# Patient Record
Sex: Male | Born: 1935 | Race: White | Hispanic: No | Marital: Single | Smoking: Former smoker
Health system: Southern US, Community
[De-identification: ages and names within clinical notes are randomized; demographics above are authoritative.]

## PROBLEM LIST (undated history)

## (undated) DIAGNOSIS — I10 Essential (primary) hypertension: Secondary | ICD-10-CM

## (undated) DIAGNOSIS — E7800 Pure hypercholesterolemia, unspecified: Secondary | ICD-10-CM

## (undated) DIAGNOSIS — M4620 Osteomyelitis of vertebra, site unspecified: Secondary | ICD-10-CM

## (undated) HISTORY — DX: Osteomyelitis of vertebra, site unspecified: M46.20

## (undated) HISTORY — PX: CORONARY STENT PLACEMENT: SHX1402

## (undated) HISTORY — PX: PROCTECTOMY: SHX315

---

## 2015-02-18 DIAGNOSIS — I10 Essential (primary) hypertension: Secondary | ICD-10-CM

## 2015-02-18 DIAGNOSIS — I2510 Atherosclerotic heart disease of native coronary artery without angina pectoris: Secondary | ICD-10-CM | POA: Insufficient documentation

## 2015-02-18 DIAGNOSIS — E7800 Pure hypercholesterolemia, unspecified: Secondary | ICD-10-CM

## 2015-02-18 HISTORY — DX: Essential (primary) hypertension: I10

## 2015-02-18 HISTORY — DX: Atherosclerotic heart disease of native coronary artery without angina pectoris: I25.10

## 2015-10-29 ENCOUNTER — Encounter (HOSPITAL_COMMUNITY): Payer: Self-pay | Admitting: Cardiology

## 2015-10-29 ENCOUNTER — Inpatient Hospital Stay (HOSPITAL_COMMUNITY)
Admission: EM | Admit: 2015-10-29 | Discharge: 2015-11-02 | DRG: 539 | Disposition: A | Discharge status: Home Health | Payer: Medicare Other | Attending: Family Medicine | Admitting: Family Medicine

## 2015-10-29 DIAGNOSIS — Z7902 Long term (current) use of antithrombotics/antiplatelets: Secondary | ICD-10-CM | POA: Diagnosis not present

## 2015-10-29 DIAGNOSIS — N400 Enlarged prostate without lower urinary tract symptoms: Secondary | ICD-10-CM | POA: Diagnosis present

## 2015-10-29 DIAGNOSIS — M4624 Osteomyelitis of vertebra, thoracic region: Secondary | ICD-10-CM | POA: Diagnosis present

## 2015-10-29 DIAGNOSIS — Z79899 Other long term (current) drug therapy: Secondary | ICD-10-CM | POA: Diagnosis not present

## 2015-10-29 DIAGNOSIS — M4644 Discitis, unspecified, thoracic region: Secondary | ICD-10-CM | POA: Diagnosis present

## 2015-10-29 DIAGNOSIS — Z955 Presence of coronary angioplasty implant and graft: Secondary | ICD-10-CM

## 2015-10-29 DIAGNOSIS — Z9889 Other specified postprocedural states: Secondary | ICD-10-CM | POA: Diagnosis not present

## 2015-10-29 DIAGNOSIS — K5900 Constipation, unspecified: Secondary | ICD-10-CM | POA: Diagnosis present

## 2015-10-29 DIAGNOSIS — E785 Hyperlipidemia, unspecified: Secondary | ICD-10-CM | POA: Diagnosis present

## 2015-10-29 DIAGNOSIS — Y92009 Unspecified place in unspecified non-institutional (private) residence as the place of occurrence of the external cause: Secondary | ICD-10-CM | POA: Diagnosis not present

## 2015-10-29 DIAGNOSIS — H9190 Unspecified hearing loss, unspecified ear: Secondary | ICD-10-CM | POA: Diagnosis present

## 2015-10-29 DIAGNOSIS — B9689 Other specified bacterial agents as the cause of diseases classified elsewhere: Secondary | ICD-10-CM | POA: Diagnosis not present

## 2015-10-29 DIAGNOSIS — E875 Hyperkalemia: Secondary | ICD-10-CM | POA: Diagnosis present

## 2015-10-29 DIAGNOSIS — I2510 Atherosclerotic heart disease of native coronary artery without angina pectoris: Secondary | ICD-10-CM | POA: Diagnosis present

## 2015-10-29 DIAGNOSIS — Z87891 Personal history of nicotine dependence: Secondary | ICD-10-CM | POA: Diagnosis not present

## 2015-10-29 DIAGNOSIS — M4626 Osteomyelitis of vertebra, lumbar region: Secondary | ICD-10-CM | POA: Diagnosis present

## 2015-10-29 DIAGNOSIS — Z800 Family history of malignant neoplasm of digestive organs: Secondary | ICD-10-CM | POA: Diagnosis not present

## 2015-10-29 DIAGNOSIS — G061 Intraspinal abscess and granuloma: Secondary | ICD-10-CM

## 2015-10-29 DIAGNOSIS — I10 Essential (primary) hypertension: Secondary | ICD-10-CM | POA: Diagnosis present

## 2015-10-29 DIAGNOSIS — Z7982 Long term (current) use of aspirin: Secondary | ICD-10-CM | POA: Diagnosis not present

## 2015-10-29 DIAGNOSIS — M4620 Osteomyelitis of vertebra, site unspecified: Secondary | ICD-10-CM

## 2015-10-29 DIAGNOSIS — W11XXXA Fall on and from ladder, initial encounter: Secondary | ICD-10-CM | POA: Diagnosis present

## 2015-10-29 HISTORY — DX: Essential (primary) hypertension: I10

## 2015-10-29 HISTORY — DX: Pure hypercholesterolemia, unspecified: E78.00

## 2015-10-29 HISTORY — DX: Osteomyelitis of vertebra, lumbar region: M46.26

## 2015-10-29 HISTORY — DX: Intraspinal abscess and granuloma: G06.1

## 2015-10-29 LAB — CBC WITH DIFFERENTIAL/PLATELET
Basophils Absolute: 0 10*3/uL (ref 0.0–0.1)
Basophils Relative: 0 %
Eosinophils Absolute: 0 10*3/uL (ref 0.0–0.7)
Eosinophils Relative: 0 %
HCT: 34.1 % — ABNORMAL LOW (ref 39.0–52.0)
Hemoglobin: 10.9 g/dL — ABNORMAL LOW (ref 13.0–17.0)
Lymphocytes Relative: 28 %
Lymphs Abs: 1 10*3/uL (ref 0.7–4.0)
MCH: 28.8 pg (ref 26.0–34.0)
MCHC: 32 g/dL (ref 30.0–36.0)
MCV: 90.2 fL (ref 78.0–100.0)
Monocytes Absolute: 0.1 10*3/uL (ref 0.1–1.0)
Monocytes Relative: 2 %
Neutro Abs: 2.4 10*3/uL (ref 1.7–7.7)
Neutrophils Relative %: 70 %
Platelets: 233 10*3/uL (ref 150–400)
RBC: 3.78 MIL/uL — ABNORMAL LOW (ref 4.22–5.81)
RDW: 16.3 % — ABNORMAL HIGH (ref 11.5–15.5)
WBC: 3.5 10*3/uL — ABNORMAL LOW (ref 4.0–10.5)

## 2015-10-29 LAB — BASIC METABOLIC PANEL
Anion gap: 10 (ref 5–15)
BUN: 12 mg/dL (ref 6–20)
CO2: 23 mmol/L (ref 22–32)
Calcium: 9.5 mg/dL (ref 8.9–10.3)
Chloride: 105 mmol/L (ref 101–111)
Creatinine, Ser: 0.87 mg/dL (ref 0.61–1.24)
GFR calc Af Amer: >60 mL/min (ref >60)
GFR calc non Af Amer: >60 mL/min (ref >60)
Glucose, Bld: 167 mg/dL — ABNORMAL HIGH (ref 65–99)
Potassium: 5.6 mmol/L — ABNORMAL HIGH (ref 3.5–5.1)
Sodium: 138 mmol/L (ref 135–145)

## 2015-10-29 LAB — C-REACTIVE PROTEIN: CRP: 1.5 mg/dL — ABNORMAL HIGH (ref <1.0)

## 2015-10-29 LAB — SEDIMENTATION RATE: Sed Rate: 90 mm/hr — ABNORMAL HIGH (ref 0–16)

## 2015-10-29 MED ORDER — SODIUM CHLORIDE 0.9 % IV SOLN
INTRAVENOUS | Status: DC
  Administered 2015-10-29 – 2015-10-31 (×2): via INTRAVENOUS

## 2015-10-29 MED ORDER — ACETAMINOPHEN 650 MG RE SUPP: 650.0000 mg | Freq: Four times a day (QID) | RECTAL | Status: DC | PRN

## 2015-10-29 MED ORDER — ACETAMINOPHEN 325 MG PO TABS
650.0000 mg | ORAL_TABLET | Freq: Four times a day (QID) | ORAL | Status: DC | PRN
  Administered 2015-10-29 – 2015-10-31 (×4): 650 mg via ORAL
  Filled 2015-10-29 (×4): qty 2

## 2015-10-29 MED ORDER — HEPARIN SODIUM (PORCINE) 5000 UNIT/ML IJ SOLN
5000.0000 [IU] | Freq: Three times a day (TID) | INTRAMUSCULAR | Status: DC
  Administered 2015-10-29: 5000 [IU] via SUBCUTANEOUS
  Filled 2015-10-29 (×2): qty 1

## 2015-10-29 NOTE — ED Provider Notes (Signed)
CSN: [id number]     Arrival date & time 10/29/15  1355 History   First MD Initiated Contact with Patient 10/29/15 1601     Chief Complaint  Patient presents with  . Back Pain     (Consider location/radiation/quality/duration/timing/severity/associated sxs/prior Treatment) HPI   85yM sent for evaluation of "infection in my back." Reports that he had MRI of his back yesterday done through [facility] and advised he should come to [facility]. Unfortunately, he is not sure of specifics and records not immediately available.  He has been having back pain for several months. Mid to lower back. Initially started after fall from ladder while painting. Sometimes radiates around into b/l flanks. During this time he was diagnosed and treated for bladder/ureteral stones as well as having what sounds like a prostatectomy. His pain has continued to wax/wane and "move" despite urologic interventions.   Denies fever or chills. Back pain doesn't seem any worse with movement. Denies hx of back surgery. No acute numbness, tingling or loss of strength. Denies change in sensation. On ASA/plavix. No incontinence or retention. Reports has been urinating better since prostatectomy.   Past Medical History  Diagnosis Date  . Hypertension   . Hypercholesteremia    Past Surgical History  Procedure Laterality Date  . Coronary stent placement    . Proctectomy     History reviewed. No pertinent family history. Social History  Substance Use Topics  . Smoking status: Never Smoker   . Smokeless tobacco: None  . Alcohol Use: No    Review of Systems  All systems reviewed and negative, other than as noted in HPI.   Allergies  Review of patient's allergies indicates no known allergies.  Home Medications   Prior to Admission medications   Not on File   BP 128/91 mmHg  Pulse 70  Temp(Src) 97.6 F (36.4 C) (Oral)  Resp 18  SpO2 92% Physical Exam  Constitutional: He appears well-developed and  well-nourished. No distress.  HENT:  Head: Normocephalic and atraumatic.  Eyes: Conjunctivae are normal. Right eye exhibits no discharge. Left eye exhibits no discharge.  Neck: Neck supple.  Cardiovascular: Normal rate, regular rhythm and normal heart sounds.  Exam reveals no gallop and no friction rub.   No murmur heard. Pulmonary/Chest: Effort normal and breath sounds normal. No respiratory distress.  Abdominal: Soft. He exhibits no distension. There is no tenderness.  Musculoskeletal: He exhibits edema. He exhibits no tenderness.  Pitting LE edema. Pt reports baseline.   Neurological: He is alert. No cranial nerve deficit. He exhibits normal muscle tone. Coordination normal.  Strength 5/5 b/l LE ext. Sensation intact to light touch.   Skin: Skin is warm and dry.  Psychiatric: He has a normal mood and affect. His behavior is normal. Thought content normal.  Nursing note and vitals reviewed.   ED Course  Procedures (including critical care time) Labs Review Labs Reviewed  SEDIMENTATION RATE - Abnormal; Notable for the following:    Sed Rate 90 (*)    All other components within normal limits  C-REACTIVE PROTEIN - Abnormal; Notable for the following:    CRP 1.5 (*)    All other components within normal limits  CBC WITH DIFFERENTIAL/PLATELET - Abnormal; Notable for the following:    WBC 3.5 (*)    RBC 3.78 (*)    Hemoglobin 10.9 (*)    HCT 34.1 (*)    RDW 16.3 (*)    All other components within normal limits  BASIC METABOLIC PANEL -  Abnormal; Notable for the following:    Potassium 5.6 (*)    Glucose, Bld 167 (*)    All other components within normal limits  BASIC METABOLIC PANEL - Abnormal; Notable for the following:    Glucose, Bld 112 (*)    All other components within normal limits  CBC - Abnormal; Notable for the following:    RBC 3.31 (*)    Hemoglobin 9.3 (*)    HCT 29.6 (*)    RDW 16.1 (*)    All other components within normal limits  PROTIME-INR - Abnormal;  Notable for the following:    Prothrombin Time 15.4 (*)    All other components within normal limits  CBC - Abnormal; Notable for the following:    WBC 3.7 (*)    RBC 3.42 (*)    Hemoglobin 9.7 (*)    HCT 31.0 (*)    RDW 16.2 (*)    All other components within normal limits  CULTURE, BLOOD (ROUTINE X 2)  CULTURE, BLOOD (ROUTINE X 2)  GRAM STAIN  CULTURE, ROUTINE-ABSCESS  APTT  VANCOMYCIN, TROUGH  CREATININE, SERUM    Imaging Review No results found. I have personally reviewed and evaluated these images and lab results as part of my medical decision-making.   EKG Interpretation None      MDM   Final diagnoses:  Vertebral osteomyelitis (HCC)  Spinal epidural abscess    85yM sent for evaluation of "infection in his back." Vertebral osteomyelitis? SEA? [facility]. Araceli BoucheShould've been read by [facility]. Will obtain report in addition to recent records [facility].   Received MRI report (most pertinent below). [facility].   "There is fluid in disc space and destruction of the adjacent endplates at T11-12 compatible with disc osteomyelitis. Epidural collection extends 4 cm from the T10-11 level through the midbody of T12. The extends into both neural foramina at T11-12. There is diffuse abnormal marrow signal. Extensive heterogenous fluid extends into the paraspinous soft tissues at the T11-12 level."  He appears very well. Afebrile. No acute neurological complaints. Nonfocal neuro exam. No murmur appreciated. Will place IV. Basic labs, blood cultures and inflammatory markers. Discussed briefly with Dr Luciana Axeomer, ID. Confirms he would wait for abx until biopsy completed. Medicine admission. Likely IR consult for biopsy versus less likely open with neurosurgery.   Raeford RazorStephen Rachal Dvorsky, MD 11/03/15 [phone]

## 2015-10-29 NOTE — ED Notes (Signed)
Pt reports that he had an MRI yesterday and told that he has an infection in his back. Sent here for antibiotics.

## 2015-10-29 NOTE — H&P (Signed)
[facility] Admission History and Physical Service Pager: [phone]  Patient name: Jacob Benitez Medical record number: [id number] Date of birth: April 29, 1930 Age: 82 y.o. Gender: male  Primary Care Provider: No primary care provider on file. Consultants: ID Code Status: FULL   Chief Complaint: Infection in Back   Assessment and Plan: Jacob Benitez is a 82 y.o. male presenting with back pain secondary to vertebral osteomyelitis and spinal epidural abscess. PMH is significant for HTN, HLD, CAD s/p stent, and s/p cystotomy and prostatectomy.  Vertebral Osteomyelitis and Spinal Epidural Abscess: MRI performed yesterday at outside imaging location. Per ED provider's note, most pertinent part of MRI read as follows: There is fluid in disc space and destruction of the adjacent endplates at I95-18 compatible with disc osteomyelitis. Epidural collection extends 4 cm from the T10-11 level through the midbody of T12. The extends into both neural foramina at T11-12. There is diffuse abnormal marrow signal. Extensive heterogenous fluid extends into the paraspinous soft tissues at the T11-12 level. Patient without fever, leukocytosis, or neurological deficits on exam. CRP elevated to 1.5 and ESR elevated to 90. ED provider discussed with ID (Jacob Benitez) who stated to wait for antibiotics until biopsy completed. Suspect this infection related to complication from recent surgical procedures.  -admit to MedSurg, vital signs per unit  -consult IR in AM for possible biopsy  -confirmed with radiology dept that they are able to visualize the actual MRI on the PACs system -will not start antibiotics at present per ID recs; patient will likely need prolonged course of IV antibiotics  -CBC in AM  -blood cultures x2  -tylenol prn for pain, will escalate to stronger pain medications if needed   Hyperkalemia: K elevated to 5.6 on admission. EKG with T wave changes.   -BMET in AM  -no  intervention at present   HTN: Normotensive at admission.  -awaiting confirmation of home medications  -continue to monitor  CAD s/p stent: Daughter believes patient may still be holding ASA and Plavix s/p surgery.  -awaiting confirmation of current medication regimen   HLD: Daughter believes patient is on a statin. From medication list from care everywhere appears he might be taking Simvastatin 40 mg.  -confirm with daughter   BPH S/p Cystotomy and Prostatectomy: Reports urinating much better since surgery. Daughter does not believe he has continued Terazosin since surgery.  -will not order Terazosin unless confirmed still on medication list   Dementia: Daughter reports that patient makes most of his own decisions and she does not believe his memory is bad. Aricept is listed on medication list from care everywhere.  -confirm with daughter tomorrow   Hearing Loss: Stable. Daughter notes he is supposed to go to the [place] later this month to obtain hearing aids.   Med list from care everywhere aspirin (ECOTRIN) 81 MG tablet, Take 81 mg by mouth daily., Disp: , Rfl:  . calcium-vitamin D 500 mg(1,273m) -200 unit per tablet, Take 1 tablet by mouth 2 (two) times a day with meals., Disp: , Rfl:  . clopidogrel (PLAVIX) 75 mg tablet, TAKE ONE TABLET BY MOUTH ONCE DAILY. DUE FOR APPOINTMENT., Disp: 30 tablet, Rfl: 0 . donepezil (ARICEPT) 10 MG tablet, Take 10 mg by mouth Two (2) times a day., Disp: , Rfl:  . lisinopril (PRINIVIL,ZESTRIL) 20 MG tablet, TAKE ONE TABLET BY MOUTH ONCE DAILY, Disp: 30 tablet, Rfl: 6 . multivitamin (THERAGRAN) per tablet, Take 1 tablet by mouth daily., Disp: , Rfl:  . nitroglycerin (  NITROSTAT) 0.4 MG SL tablet, Place 0.4 mg under the tongue every five (5) minutes as needed for chest pain., Disp: , Rfl:  . OMEGA-3/DHA/EPA/FISH OIL (FISH OIL-OMEGA-3 FATTY ACIDS) 300-1,000 mg capsule, Take 1 g by mouth. Take 3 Capsules Daily, Disp: , Rfl:  . simvastatin (ZOCOR) 40 MG tablet,  TAKE ONE TABLET BY MOUTH ONCE DAILY.DUE FOR APPOINTMENT., Disp: 30 tablet, Rfl: 0 . terazosin (HYTRIN) 5 MG capsule, Take 5 mg by mouth nightly., Disp: , Rfl:    FEN/GI: Regular Diet, NPO at MN, NS at 75 cc/hr  Prophylaxis: Heparin   Disposition: Admit to FPTS; Attending Jacob Benitez   History of Present Illness:  Jacob Benitez is a 82 y.o. male presenting with back pain. Daughter at bedside helps with history, patient is hard of hearing. He developed back pain in January, went to the hospital and scans showed kidney stones which required removal (including removal of the prostate). He continued to have pain in the back several weeks later so they did an MRI of the spine which showed osteomyelitis and spinal epidural abscess. Pain is not currently "that bad", it seems to come and go, severe at times up to 10/10. Pain is worse with movement. No tingling, numbness of his legs. He has had swelling of his legs since January.   No known prior history of infections. There was a history of "hardening" on his foot 6 months ago. Unsure if he was treated with antibiotics. Denies known history of osteomyelitis or bacteremia.   Review Of Systems: Per HPI with the following additions: no recent fevers, no nausea or vomiting, no changes in bowel pattern (no reported diarrhea or constipation), voiding improved since prostate procedure Otherwise the remainder of the systems were negative.  Patient Active Problem List   Diagnosis Date Noted  . Spinal epidural abscess 10/29/2015    Past Medical History: Past Medical History  Diagnosis Date  . Hypertension   . Hypercholesteremia     Past Surgical History: Past Surgical History  Procedure Laterality Date  . Coronary stent placement    . Proctectomy      Social History: Social History  Substance Use Topics  . Smoking status: Never Smoker   . Smokeless tobacco: None  . Alcohol Use: No   Additional social history: lives with wife at home, has had  PT doing home visits Please also refer to relevant sections of EMR.  Family History: Family History  Problem Relation Age of Onset  . Colon cancer Mother    Allergies and Medications: No Known Allergies No current facility-administered medications on file prior to encounter.   No current outpatient prescriptions on file prior to encounter.    Objective: BP 120/66 mmHg  Pulse 72  Temp(Src) 97.6 F (36.4 C) (Oral)  Resp 16  SpO2 97% Exam: General: Elderly male lying in bed, non-toxic, NAD  Eyes: EOMI. PERRL.  ENTM: Oropharynx clear. Hard of hearing.  Neck: Full ROM. No rigidity.  Cardiovascular: RRR. No murmurs appreciated. LE cool to palpation. 1+ pedal pulses bilaterally. 1+ pitting edema to LE bilaterally.  Respiratory: CTAB. Normal WOB.  Abdomen: +BS, soft, NTND, surgical scar across suprapubic region with foreign device vs. Scar tissue palpated under incision scar  MSK: Moves all extremities spontaneously. Small area of erythema around lumbar spine. No edema or fluctuance appreciated. Spine non-tender to palpation.  Skin: No rashes appreciated. Erythema of lumbar spine as above Neuro: Alert. Sensation grossly intact. Strength in LE 5/5 bilaterally. Grip strength 5/5 bilaterally.  Psych: Normal mood and affect.   Labs and Imaging: CBC BMET   Recent Labs Lab 10/29/15 1723  WBC 3.5*  HGB 10.9*  HCT 34.1*  PLT 233    Recent Labs Lab 10/29/15 1723  NA 138  K 5.6*  CL 105  CO2 23  BUN 12  CREATININE 0.87  GLUCOSE 167*  CALCIUM 9.5     ESR 90 CRP 1.5   Excerpt from MRI Report: There is fluid in disc space and destruction of the adjacent endplates at V20-03 compatible with disc osteomyelitis. Epidural collection extends 4 cm from the T10-11 level through the midbody of T12. The extends into both neural foramina at T11-12. There is diffuse abnormal marrow signal. Extensive heterogenous fluid extends into the paraspinous soft tissues at the T11-12  level."  Nicolette Bang, DO 10/29/2015, 6:46 PM PGY-1, [place] Intern pager: [phone], text pages welcome  I have seen and examined the patient. I have read and agree with the above note. My changes are noted in blue.  Tawanna Sat, MD 10/30/2015, 2:17 AM PGY-3, [place] Intern Pager: [phone], text pages welcome

## 2015-10-30 ENCOUNTER — Inpatient Hospital Stay (HOSPITAL_COMMUNITY): Payer: Medicare Other

## 2015-10-30 DIAGNOSIS — M4644 Discitis, unspecified, thoracic region: Secondary | ICD-10-CM

## 2015-10-30 DIAGNOSIS — G061 Intraspinal abscess and granuloma: Secondary | ICD-10-CM

## 2015-10-30 DIAGNOSIS — M4626 Osteomyelitis of vertebra, lumbar region: Secondary | ICD-10-CM

## 2015-10-30 DIAGNOSIS — M4620 Osteomyelitis of vertebra, site unspecified: Secondary | ICD-10-CM

## 2015-10-30 DIAGNOSIS — B9689 Other specified bacterial agents as the cause of diseases classified elsewhere: Secondary | ICD-10-CM

## 2015-10-30 LAB — GRAM STAIN

## 2015-10-30 LAB — BASIC METABOLIC PANEL
ANION GAP: 7 (ref 5–15)
BUN: 15 mg/dL (ref 6–20)
CALCIUM: 8.9 mg/dL (ref 8.9–10.3)
CO2: 26 mmol/L (ref 22–32)
Chloride: 106 mmol/L (ref 101–111)
Creatinine, Ser: 0.77 mg/dL (ref 0.61–1.24)
GLUCOSE: 112 mg/dL — AB (ref 65–99)
POTASSIUM: 5.1 mmol/L (ref 3.5–5.1)
SODIUM: 139 mmol/L (ref 135–145)

## 2015-10-30 LAB — CBC
HEMATOCRIT: 29.6 % — AB (ref 39.0–52.0)
HEMOGLOBIN: 9.3 g/dL — AB (ref 13.0–17.0)
MCH: 28.1 pg (ref 26.0–34.0)
MCHC: 31.4 g/dL (ref 30.0–36.0)
MCV: 89.4 fL (ref 78.0–100.0)
Platelets: 262 10*3/uL (ref 150–400)
RBC: 3.31 MIL/uL — AB (ref 4.22–5.81)
RDW: 16.1 % — ABNORMAL HIGH (ref 11.5–15.5)
WBC: 5.5 10*3/uL (ref 4.0–10.5)

## 2015-10-30 LAB — PROTIME-INR
INR: 1.2 (ref 0.00–1.49)
PROTHROMBIN TIME: 15.4 s — AB (ref 11.6–15.2)

## 2015-10-30 LAB — APTT: APTT: 31 s (ref 24–37)

## 2015-10-30 MED ORDER — GABAPENTIN 100 MG PO CAPS
100.0000 mg | ORAL_CAPSULE | Freq: Three times a day (TID) | ORAL | Status: DC
  Administered 2015-10-30 – 2015-11-02 (×10): 100 mg via ORAL
  Filled 2015-10-30 (×10): qty 1

## 2015-10-30 MED ORDER — BUPIVACAINE HCL (PF) 0.25 % IJ SOLN
INTRAMUSCULAR | Status: AC | PRN
  Administered 2015-10-30: 10 mL

## 2015-10-30 MED ORDER — FENTANYL CITRATE (PF) 100 MCG/2ML IJ SOLN
INTRAMUSCULAR | Status: AC
  Filled 2015-10-30: qty 4

## 2015-10-30 MED ORDER — OXYCODONE HCL 5 MG PO TABS
5.0000 mg | ORAL_TABLET | Freq: Four times a day (QID) | ORAL | Status: DC | PRN
  Administered 2015-10-30 – 2015-11-02 (×7): 5 mg via ORAL
  Filled 2015-10-30 (×7): qty 1

## 2015-10-30 MED ORDER — HEPARIN SODIUM (PORCINE) 5000 UNIT/ML IJ SOLN
5000.0000 [IU] | Freq: Three times a day (TID) | INTRAMUSCULAR | Status: DC
Start: 2015-10-30 — End: 2015-11-03
  Administered 2015-10-30 – 2015-11-02 (×9): 5000 [IU] via SUBCUTANEOUS
  Filled 2015-10-30 (×9): qty 1

## 2015-10-30 MED ORDER — BUPIVACAINE HCL (PF) 0.25 % IJ SOLN
INTRAMUSCULAR | Status: AC
Start: 2015-10-30 — End: 2015-10-31
  Filled 2015-10-30: qty 30

## 2015-10-30 MED ORDER — DONEPEZIL HCL 10 MG PO TABS
20.0000 mg | ORAL_TABLET | Freq: Two times a day (BID) | ORAL | Status: DC
  Administered 2015-10-30 – 2015-11-02 (×7): 20 mg via ORAL
  Filled 2015-10-30 (×2): qty 2
  Filled 2015-10-30: qty 4
  Filled 2015-10-30 (×4): qty 2

## 2015-10-30 MED ORDER — DEXTROSE 5 % IV SOLN
2.0000 g | INTRAVENOUS | Status: DC
  Administered 2015-10-30 – 2015-10-31 (×2): 2 g via INTRAVENOUS
  Filled 2015-10-30 (×3): qty 2

## 2015-10-30 MED ORDER — HYDROCODONE-ACETAMINOPHEN 5-325 MG PO TABS: 1.0000 | ORAL_TABLET | ORAL | Status: DC | PRN

## 2015-10-30 MED ORDER — MIDAZOLAM HCL 2 MG/2ML IJ SOLN
INTRAMUSCULAR | Status: AC
Start: 2015-10-30 — End: 2015-10-31
  Filled 2015-10-30: qty 4

## 2015-10-30 MED ORDER — SODIUM CHLORIDE 0.9 % IV SOLN: INTRAVENOUS | Status: AC

## 2015-10-30 MED ORDER — VANCOMYCIN HCL 10 G IV SOLR
1250.0000 mg | INTRAVENOUS | Status: AC
  Administered 2015-10-30: 1250 mg via INTRAVENOUS
  Filled 2015-10-30: qty 1250

## 2015-10-30 MED ORDER — ROPINIROLE HCL 0.5 MG PO TABS
0.2500 mg | ORAL_TABLET | Freq: Every day | ORAL | Status: DC
  Administered 2015-10-30 – 2015-11-01 (×4): 0.25 mg via ORAL
  Filled 2015-10-30 (×4): qty 1

## 2015-10-30 MED ORDER — MIDAZOLAM HCL 2 MG/2ML IJ SOLN
INTRAMUSCULAR | Status: AC | PRN
  Administered 2015-10-30: 1 mg via INTRAVENOUS

## 2015-10-30 MED ORDER — VANCOMYCIN HCL 1000 MG IV SOLR
750.0000 mg | Freq: Two times a day (BID) | INTRAVENOUS | Status: AC
  Administered 2015-10-31 – 2015-11-01 (×4): 750 mg via INTRAVENOUS
  Filled 2015-10-30 (×5): qty 750

## 2015-10-30 MED ORDER — FENTANYL CITRATE (PF) 100 MCG/2ML IJ SOLN
INTRAMUSCULAR | Status: AC | PRN
  Administered 2015-10-30: 25 ug via INTRAVENOUS

## 2015-10-30 MED ORDER — SODIUM CHLORIDE 0.9 % IV SOLN
INTRAVENOUS | Status: AC | PRN
  Administered 2015-10-30: 10 mL/h via INTRAVENOUS

## 2015-10-30 NOTE — Progress Notes (Signed)
[facility]  Patient Status:   [facility] pt [facility] services: HHRN and Home Infusion IV ABX.  Discussed POC for home IV ABX with pt and he feels his wife will be able to learn.  [facility] will support transition to home if that is the chosen destination upon DC. If pt chooses, SNF or Rehab, [facility] community team will follow at the facility to support transition to home when ordered.  If patient discharges after hours, please call [phone].   Jacob Benitez 10/30/2015, 5:34 PM

## 2015-10-30 NOTE — Progress Notes (Signed)
Family Medicine Teaching Service Daily Progress Note Intern Pager: [phone]  Patient name: Jacob Benitez Medical record number: [id number] Date of birth: [birth year] Age: 82 y.o. Gender: male  Primary Care Provider: No primary care provider on file. Consultants: IR, ID  Code Status: FULL   Pt Overview and Major Events to Date:  3/23: Admitted with vertebral osteomyelitis and spinal epidural abscess   Assessment and Plan: Jacob Benitez is a 82 y.o. male presenting with back pain secondary to vertebral osteomyelitis and spinal epidural abscess. PMH is significant for HTN, HLD, CAD s/p stent, and s/p cystotomy and prostatectomy.  Vertebral Osteomyelitis and Spinal Epidural Abscess: MRI performed yesterday at outside imaging location. Per ED provider's note, most pertinent part of MRI read as follows: There is fluid in disc space and destruction of the adjacent endplates at A19-37 compatible with disc osteomyelitis. Epidural collection extends 4 cm from the T10-11 level through the midbody of T12. The extends into both neural foramina at T11-12. There is diffuse abnormal marrow signal. Extensive heterogenous fluid extends into the paraspinous soft tissues at the T11-12 level. Patient without fever, leukocytosis, or neurological deficits on exam. CRP elevated to 1.5 and ESR elevated to 90. ED provider discussed with ID (Comer) who stated to wait for antibiotics until biopsy completed. Suspect this infection related to complication from recent surgical procedures.  -admit to MedSurg, vital signs per unit  -IR consulted for possible abscess drainage  -confirmed with radiology dept that they are able to visualize the actual MRI on the PACs system -will not start antibiotics at present per ID recs; patient will likely need prolonged course of IV antibiotics  -CBC in AM  -blood cultures x2  -tylenol prn for pain, will escalate to stronger pain medications if needed, patient complaining of  pain this morning but has not received tylenol since MN (will give another dose and f/u pain control)   Hyperkalemia, Resolved: K elevated to 5.6 on admission. EKG with T wave changes.  -BMET in AM: K 5.1  -no intervention at present   HTN: Normotensive at admission.  -awaiting confirmation of home medications  -continue to monitor  CAD s/p stent: Daughter believes patient may still be holding ASA and Plavix s/p surgery.  -awaiting confirmation of current medication regimen   HLD: Daughter believes patient is on a statin. From medication list from care everywhere appears he might be taking Simvastatin 40 mg.  -confirm with daughter   BPH S/p Cystotomy and Prostatectomy: Reports urinating much better since surgery. Daughter does not believe he has continued Terazosin since surgery.  -will not order Terazosin unless confirmed still on medication list   Dementia: Daughter reports that patient makes most of his own decisions and she does not believe his memory is bad. Aricept is listed on medication list from care everywhere.  -confirm with daughter tomorrow   Hearing Loss: Stable. Daughter notes he is supposed to go to the [place] later this month to obtain hearing aids.    FEN/GI: Regular Diet, NPO at MN, NS at 75 cc/hr  Prophylaxis: Heparin, holding for possible sx    Disposition: Home pending IR/ID recs for vertebral osteo and spinal abscess   Subjective:  Some increased pain this morning. Otherwise, feeling well.   Objective: Temp:  [97.6 F (36.4 C)-98.1 F (36.7 C)] 97.6 F (36.4 C) (03/24 0500) Pulse Rate:  [70-78] 78 (03/24 0500) Resp:  [12-21] 18 (03/24 0500) BP: (119-145)/(66-107) 138/72 mmHg (03/24 0500) SpO2:  [92 %-100 %] 100 % (  03/24 0500) Physical Exam: General: Elderly male lying in bed, non-toxic, NAD  ENTM:  Hard of hearing.  Neck: Full ROM. No rigidity.  Cardiovascular: RRR. No murmurs appreciated. LE cool to palpation. 1+ pedal pulses  bilaterally. 1+ pitting edema to LE bilaterally.  Respiratory: CTAB. Normal WOB.  Abdomen: +BS, soft, NTND, surgical scar across suprapubic region with foreign device vs. Scar tissue palpated under incision scar  MSK: Moves all extremities spontaneously.Spine non-tender to palpation.  Neuro: Alert. Sensation grossly intact. Strength in LE 5/5 bilaterally. Grip strength 5/5 bilaterally.  Psych: Normal mood and affect.   Laboratory:  Recent Labs Lab 10/29/15 1723 10/30/15 0400  WBC 3.5* 5.5  HGB 10.9* 9.3*  HCT 34.1* 29.6*  PLT 233 262    Recent Labs Lab 10/29/15 1723 10/30/15 0400  NA 138 139  K 5.6* 5.1  CL 105 106  CO2 23 26  BUN 12 15  CREATININE 0.87 0.77  CALCIUM 9.5 8.9  GLUCOSE 167* 112*    Imaging/Diagnostic Tests: Excerpt from MRI Report: There is fluid in disc space and destruction of the adjacent endplates at P71-06 compatible with disc osteomyelitis. Epidural collection extends 4 cm from the T10-11 level through the midbody of T12. The extends into both neural foramina at T11-12. There is diffuse abnormal marrow signal. Extensive heterogenous fluid extends into the paraspinous soft tissues at the T11-12 level."  Nicolette Bang, DO 10/30/2015, 9:40 AM PGY-1, [place] Intern pager: [phone], text pages welcome

## 2015-10-30 NOTE — Procedures (Signed)
S/p fLUORO GUIDED t11-t12 DISC ASPIRATION. Approx 5ml of thick bloody fluid aspirated and sent for microbiological analysis

## 2015-10-30 NOTE — Consult Note (Signed)
Chief Complaint: Patient was seen in consultation today for Thoracic 11 - 12 disc aspiration Chief Complaint  Patient presents with  . Back Pain   at the request of Dr Janit Pagan  Referring Physician(s): Dr Janit Pagan Dr Staci Righter  Supervising Physician: Dr Julieanne Cotton  History of Present Illness: Jacob Benitez is a 82 y.o. male   Pt fell from ladder at home 3 mo ago Presented to [facility] ED Was treated for his renal stones Pain continued and worsened MRI reveals T11-12 abscess/disc osteomyelitis Now admitted to [facility] Request for aspiration of disc aspiration prior to antibiotic treatment Per Dr Lum Babe and Dr Luciana Axe Dr Corliss Skains has reviewed imaging and approves procedure  Pt has hx of using Plavix I have spoken to daughter who confirms pt has not been on Plavix for weeks at this point She is also aware of this procedure and wants to move ahead  Past Medical History  Diagnosis Date  . Hypertension   . Hypercholesteremia     Past Surgical History  Procedure Laterality Date  . Coronary stent placement    . Proctectomy      Allergies: Review of patient's allergies indicates no known allergies.  Medications: Prior to Admission medications   Medication Sig Start Date End Date Taking? Authorizing Provider  cyclobenzaprine (FLEXERIL) 5 MG tablet Take 5 mg by mouth every 12 (twelve) hours. 10/22/15  Yes Historical Provider, MD  donepezil (ARICEPT) 10 MG tablet Take 20 mg by mouth 2 (two) times daily. 10/22/15  Yes Historical Provider, MD  gabapentin (NEURONTIN) 100 MG capsule Take 100 mg by mouth 3 (three) times daily. 10/16/15  Yes Historical Provider, MD  ibuprofen (ADVIL,MOTRIN) 200 MG tablet Take 200 mg by mouth every 6 (six) hours as needed for moderate pain.   Yes Historical Provider, MD  predniSONE (DELTASONE) 20 MG tablet Take 40 mg by mouth daily. 10/26/15  Yes Historical Provider, MD     Family History  Problem Relation Age of Onset  .  Colon cancer Mother     Social History   Social History  . Marital Status: Single    Spouse Name: N/A  . Number of Children: N/A  . Years of Education: N/A   Social History Main Topics  . Smoking status: Former Smoker -- 1.00 packs/day for 20 years    Types: Cigarettes    Quit date: 10/29/1978  . Smokeless tobacco: None  . Alcohol Use: No  . Drug Use: No  . Sexual Activity: Not Asked   Other Topics Concern  . None   Social History Narrative     Review of Systems: A 12 point ROS discussed and pertinent positives are indicated in the HPI above.  All other systems are negative.  Review of Systems  Constitutional: Positive for activity change and fatigue. Negative for fever.  HENT: Positive for hearing loss.   Respiratory: Negative for shortness of breath.   Gastrointestinal: Negative for abdominal pain.  Musculoskeletal: Positive for back pain and gait problem.  Neurological: Positive for weakness.  Psychiatric/Behavioral: Negative for behavioral problems and confusion.    Vital Signs: BP 138/72 mmHg  Pulse 78  Temp(Src) 97.6 F (36.4 C) (Oral)  Resp 18  SpO2 100%  Physical Exam  Constitutional: He is oriented to person, place, and time.  Cardiovascular: Normal rate, regular rhythm and normal heart sounds.   Pulmonary/Chest: Effort normal.  Abdominal: Soft. Bowel sounds are normal.  Musculoskeletal: Normal range of motion.  Moves alll 4s  Neurological: He is alert and oriented to person, place, and time.  Skin: Skin is warm and dry.  Psychiatric: He has a normal mood and affect. His behavior is normal. Judgment and thought content normal.  Nursing note and vitals reviewed.   Mallampati Score:  MD Evaluation Airway: WNL Heart: WNL Abdomen: WNL ASA  Classification: 3 Mallampati/Airway Score: Two  Imaging: No results found.  Labs:  CBC:  Recent Labs  10/29/15 1723 10/30/15 0400  WBC 3.5* 5.5  HGB 10.9* 9.3*  HCT 34.1* 29.6*  PLT 233 262     COAGS: No results for input(s): INR, APTT in the last 8760 hours.  BMP:  Recent Labs  10/29/15 1723 10/30/15 0400  NA 138 139  K 5.6* 5.1  CL 105 106  CO2 23 26  GLUCOSE 167* 112*  BUN 12 15  CALCIUM 9.5 8.9  CREATININE 0.87 0.77  GFRNONAA >60 >60  GFRAA >60 >60    LIVER FUNCTION TESTS: No results for input(s): BILITOT, AST, ALT, ALKPHOS, PROT, ALBUMIN in the last 8760 hours.  TUMOR MARKERS: No results for input(s): AFPTM, CEA, CA199, CHROMGRNA in the last 8760 hours.  Assessment and Plan:  Back pain---worsening for 3 months Post fall at home MRI reveals T11-12 disc osteomyelitis Now scheduled for aspiration  Risks and Benefits discussed with the patient including, but not limited to bleeding, infection, damage to adjacent structures or low yield requiring additional tests. All of the patient's questions were answered, patient is agreeable to proceed. Consent signed and in chart.   Thank you for this interesting consult.  I greatly enjoyed meeting Pauline AusWalter Leroy Mundt and look forward to participating in their care.  A copy of this report was sent to the requesting provider on this date.  Electronically Signed: Ralene MuskratURPIN,Emylee Decelle A 10/30/2015, 10:44 AM   I spent a total of 40 Minutes    in face to face in clinical consultation, greater than 50% of which was counseling/coordinating care for T11-12 disc aspiration

## 2015-10-30 NOTE — Sedation Documentation (Signed)
Patient is resting comfortably. 

## 2015-10-30 NOTE — Sedation Documentation (Signed)
Patient denies pain and is resting comfortably.  

## 2015-10-30 NOTE — Consult Note (Signed)
[facility]       Reason for Consult: discitis, osteomyelitis    Referring Physician: Dr. Randolm IdolFletke  Active Problems:   Spinal epidural abscess   Osteomyelitis of lumbar spine (HCC)   Vertebral osteomyelitis (HCC)   . bupivacaine (PF)      . donepezil  20 mg Oral BID  . fentaNYL      . gabapentin  100 mg Oral TID  . heparin  5,000 Units Subcutaneous 3 times per day  . midazolam      . rOPINIRole  0.25 mg Oral QHS    Recommendations: Vancomycin and ceftriaxone 2 grams daily  picc if blood cultures remain negative at 72 hours He may prefer placement for antibiotics rather than home  Dr. Orvan Falconerampbell will monitor cultures over the weekend, I will follow up on Monday.    Assessment: He has reported discitis and epidural abscess of  T11-12 [facility]  He will need prolonged IV antibiotics depending on culture.  If negative culture, continue with current two antibiotics through at least May 18th (8 weeks) and will need follow up MRI.    Antibiotics: None prior to biopsy  HPI: Pauline AusWalter Leroy Escorcia is a 82 y.o. male with CAD, HTN who about 3 months ago fell off of a ladder.  He had continued back pain but kept hoping it would improve but didn't.  He then went to his PCP and had MRI notable for T11-12 abscess/disc osteomyelitis and sent here for admission.  No weakness, main complaint just pain.  Just had IR aspiration today and no organisms on gram stain. No fever, no chills.  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     Review of Systems:  Constitutional: negative for fevers and chills Gastrointestinal: negative for nausea and diarrhea Musculoskeletal: positive for back pain, negative for myalgias and arthralgias All other systems reviewed and are negative   Past Medical History  Diagnosis Date  . Hypertension   . Hypercholesteremia     Social History  Substance Use Topics  . Smoking status: Former  Smoker -- 1.00 packs/day for 20 years    Types: Cigarettes    Quit date: 10/29/1978  . Smokeless tobacco: None  . Alcohol Use: No    Family History  Problem Relation Age of Onset  . Colon cancer Mother     No Known Allergies  Physical Exam: Constitutional: alert mild distress with pain; anxious about condition Filed Vitals:   10/30/15 1318 10/30/15 1500  BP: 133/79 111/51  Pulse: 74 86  Temp:  98.2 F (36.8 C)  Resp: 18 16   EYES: anicteric ENMT: no thrush Cardiovascular: Cor RRR Respiratory: CTA B; normal respiratory effort GI: Bowel sounds are normal, liver is not enlarged, spleen is not enlarged Musculoskeletal: no pedal edema noted Skin: negatives: no rash Hematologic: no cervical lad  Lab Results  Component Value Date   WBC 5.5 10/30/2015   HGB 9.3* 10/30/2015   HCT 29.6* 10/30/2015   MCV 89.4 10/30/2015   PLT 262 10/30/2015    Lab Results  Component Value Date   CREATININE 0.77 10/30/2015   BUN 15 10/30/2015   NA 139 10/30/2015   K 5.1 10/30/2015   CL 106 10/30/2015   CO2 26 10/30/2015   No results found for: ALT, AST, GGT, ALKPHOS   Microbiology: Recent Results (from the past 240 hour(s))  Blood culture (routine x 2)  Status: None (Preliminary result)   Collection Time: 10/29/15  4:58 PM  Result Value Ref Range Status   Specimen Description BLOOD RIGHT ANTECUBITAL  Final   Special Requests BOTTLES DRAWN AEROBIC AND ANAEROBIC 5CC  Final   Culture NO GROWTH < 24 HOURS  Final   Report Status PENDING  Incomplete  Blood culture (routine x 2)     Status: None (Preliminary result)   Collection Time: 10/29/15  5:07 PM  Result Value Ref Range Status   Specimen Description BLOOD LEFT ANTECUBITAL  Final   Special Requests BOTTLES DRAWN AEROBIC AND ANAEROBIC 5CC  Final   Culture NO GROWTH < 24 HOURS  Final   Report Status PENDING  Incomplete  Stat Gram stain     Status: None   Collection Time: 10/30/15  2:28 PM  Result Value Ref Range Status    Specimen Description ABSCESS  Final   Special Requests T11 T12  Final   Gram Stain   Final    RARE WBC PRESENT, PREDOMINANTLY PMN NO ORGANISMS SEEN RESULT CALLED TO, READ BACK BY AND VERIFIED WITH: K ENIOLA 10/30/15 @ 1509 M VESTAL    Report Status 10/30/2015 FINAL  Final    Stewart Pimenta, Molly Maduro, MD [facility] [email or web address] [phone] pager  [phone] cell 10/30/2015, 4:12 PM

## 2015-10-30 NOTE — Consult Note (Signed)
ANTIBIOTIC CONSULT NOTE - INITIAL  Pharmacy Consult : Vancomycin  Indication : Vertebral osteomyelitis  No Known Allergies  Dosing Weight : 65.8 kg  VITALS:  Temp: 98.2 F (36.8 C) (03/24 1500)  BP: 111/51 mmHg (03/24 1500)  Pulse Rate: 86 (03/24 1500)  LABS:  Recent Labs   10/29/15 1723  10/30/15 0400   WBC  3.5*  5.5   HGB  10.9*  9.3*   PLT  233  262   CREATININE  0.87  0.77   MICRO:  Recent Results (from the past 720 hour(s))   Blood culture (routine x 2) Status: None (Preliminary result)    Collection Time: 10/29/15 4:58 PM   Result  Value  Ref Range  Status    Specimen Description  BLOOD RIGHT ANTECUBITAL   Final    Special Requests  BOTTLES DRAWN AEROBIC AND ANAEROBIC 5CC   Final    Culture  NO GROWTH < 24 HOURS   Final    Report Status  PENDING   Incomplete   Blood culture (routine x 2) Status: None (Preliminary result)    Collection Time: 10/29/15 5:07 PM   Result  Value  Ref Range  Status    Specimen Description  BLOOD LEFT ANTECUBITAL   Final    Special Requests  BOTTLES DRAWN AEROBIC AND ANAEROBIC 5CC   Final    Culture  NO GROWTH < 24 HOURS   Final    Report Status  PENDING   Incomplete   Stat Gram stain Status: None    Collection Time: 10/30/15 2:28 PM   Result  Value  Ref Range  Status    Specimen Description  ABSCESS   Final    Special Requests  T11 T12   Final    Gram Stain    Final     RARE WBC PRESENT, PREDOMINANTLY PMN  NO ORGANISMS SEEN  RESULT CALLED TO, READ BACK BY AND VERIFIED WITH: K ENIOLA 10/30/15 @ 1509 M VESTAL    Report Status  10/30/2015 FINAL   Final   ASSESSMENT:  82 y.o.with vertebral osteomyelitis and spinal epidural abscess s/p abscess drainage today is to be started on Vancomycin per Pharmacy consult.  GOAL:  Vancomycin Trough 15 - 20 mcg/ml  PLAN:  1. Vancomycin 1250 mg IV x 1  2. Vancomycin 750 mg IV every 12 hrs  3. Will check vancomycin trough at steady-state around the 5th dose or when clinically indicated. Laurena BeringStramoski,  Luevenia Mcavoy J  10/30/2015,4:27 PM

## 2015-10-31 LAB — CBC
HCT: 31 % — ABNORMAL LOW (ref 39.0–52.0)
Hemoglobin: 9.7 g/dL — ABNORMAL LOW (ref 13.0–17.0)
MCH: 28.4 pg (ref 26.0–34.0)
MCHC: 31.3 g/dL (ref 30.0–36.0)
MCV: 90.6 fL (ref 78.0–100.0)
Platelets: 214 10*3/uL (ref 150–400)
RBC: 3.42 MIL/uL — ABNORMAL LOW (ref 4.22–5.81)
RDW: 16.2 % — AB (ref 11.5–15.5)
WBC: 3.7 10*3/uL — ABNORMAL LOW (ref 4.0–10.5)

## 2015-10-31 MED ORDER — SENNA 8.6 MG PO TABS
1.0000 | ORAL_TABLET | Freq: Every day | ORAL | Status: DC
  Administered 2015-10-31 – 2015-11-02 (×3): 8.6 mg via ORAL
  Filled 2015-10-31 (×3): qty 1

## 2015-10-31 NOTE — Progress Notes (Signed)
Family Medicine Teaching Service Daily Progress Note Intern Pager: [phone]  Patient name: Jacob Benitez Medical record number: [id number] Date of birth: 07/11/1930 Age: 82 y.o. Gender: male  Primary Care Provider: No primary care provider on file. Consultants: IR, ID  Code Status: FULL   Pt Overview and Major Events to Date:  3/23: Admitted with vertebral osteomyelitis and spinal epidural abscess   Assessment and Plan: 81 y.o. male presenting with back pain secondary to vertebral osteomyelitis and spinal epidural abscess. PMH is significant for HTN, HLD, CAD s/p stent, and s/p cystotomy and prostatectomy.  # Vertebral Osteomyelitis and Spinal Epidural Abscess: MRI with fluid in disc space and destruction of the adjacent endplates at A25-05 compatible with disc osteomyelitis; epidural collection extends 4 cm from the T10-11 level through the midbody of T12 and into both neural foramina at T11-12. CRP elevated to 1.5 and ESR elevated to 90. IR performed disc aspiration on 3/24. Gram stain of epidural abscess without orgnanisms. - Afebrile - ID consulted, appreciate recommendations. Recommend Vancomycin and Ceftriaxone. PICC if blood cultures negative at 72hr (3/26 @ 1707) - Follow up blood cultures. NGTD. Collected 3/23 @ 1707. - Vancomycin (3/24>>), Ceftriaxone (3/24>>) - [facility] able to give [facility] and home infusion ABX if discharged home - Tylenol prn for mild pain, oxycodone PRN moderate pain  # HTN: Range 111-162/51-89, most recent 136/57 - No medications at this time - Continue to monitor  # CAD s/p stent: Daughter believes patient may still be holding ASA and Plavix s/p surgery.   # HLD: Not currently on statin. Consider initiating as outpatient.  FEN/GI: Regular Diet Prophylaxis: Heparin, holding for possible sx    Disposition: Home pending IR/ID recs for vertebral osteo and spinal abscess   Subjective:  Notes significant improvement in back pain. Reports constipation,  last bowel movement two days ago, requests medication. No further concerns.  Objective: Temp:  [98.1 F (36.7 C)-98.2 F (36.8 C)] 98.1 F (36.7 C) (03/25 0504) Pulse Rate:  [63-94] 74 (03/25 0504) Resp:  [16-22] 17 (03/25 0504) BP: (111-162)/(51-89) 136/57 mmHg (03/25 0504) SpO2:  [98 %-100 %] 100 % (03/25 0504) Weight:  [145 lb 1 oz (65.8 kg)-145 lb 1.6 oz (65.817 kg)] 145 lb 1 oz (65.8 kg) (03/24 1625) Physical Exam: General: Elderly male lying in bed, non-toxic, NAD  ENTM:  Hard of hearing.  Neck: Full ROM. No rigidity.  Cardiovascular: RRR. No murmurs appreciated. 1+ pedal pulses bilaterally.  Respiratory: CTAB. Normal WOB.  Abdomen: +BS, soft, NTND MSK: Moves all extremities spontaneously.Spine non-tender to palpation.  Neuro: Alert. Sensation grossly intact. Skin: bandaid in place over site of aspiration of abscess Psych: Normal mood and affect.   Laboratory:  Recent Labs Lab 10/29/15 1723 10/30/15 0400 10/31/15 0718  WBC 3.5* 5.5 3.7*  HGB 10.9* 9.3* 9.7*  HCT 34.1* 29.6* 31.0*  PLT 233 262 214    Recent Labs Lab 10/29/15 1723 10/30/15 0400  NA 138 139  K 5.6* 5.1  CL 105 106  CO2 23 26  BUN 12 15  CREATININE 0.87 0.77  CALCIUM 9.5 8.9  GLUCOSE 167* 112*  - CRP 1.5 - ESR 90  Imaging/Diagnostic Tests: Excerpt from MRI Report: There is fluid in disc space and destruction of the adjacent endplates at L97-67 compatible with disc osteomyelitis. Epidural collection extends 4 cm from the T10-11 level through the midbody of T12. The extends into both neural foramina at T11-12. There is diffuse abnormal marrow signal. Extensive heterogenous fluid extends into the  paraspinous soft tissues at the T11-12 level."  Lorna Few, DO 10/31/2015, 8:09 AM PGY-2, [place] Intern pager: [phone], text pages welcome

## 2015-10-31 NOTE — Discharge Summary (Signed)
[facility] Discharge Summary  Patient name: Jacob Benitez Medical record number: [id number] Date of birth: 01-19-1930 Age: 82 y.o. Gender: male Date of Admission: 10/29/2015  Date of Discharge: 11/03/2015 Admitting Physician: Kinnie Feil, MD  Primary Care Provider: No primary care provider on file. Consultants: ID, IR   Indication for Hospitalization: Vertebral Osteomyelitis and Spinal Epidural Abscess   Discharge Diagnoses/Problem List:  Patient Active Problem List   Diagnosis Date Noted  . Vertebral osteomyelitis ([place])   . Spinal epidural abscess 10/29/2015  . Osteomyelitis of lumbar spine ([place]) 10/29/2015    Disposition: [facility] with [facility]   Discharge Condition: Stable   Discharge Exam:  General: Elderly male sitting on side of bed, non-toxic, NAD  ENTM: Hard of hearing.  Neck: Full ROM. No rigidity.  Cardiovascular: RRR. No murmurs appreciated. 1+ pedal pulses bilaterally.  Respiratory: CTAB. Normal WOB.  Abdomen: +BS, soft, NTND MSK: Moves all extremities spontaneously.Spine non-tender to palpation.  Neuro: Alert. Sensation grossly intact. Skin: bandaid in place over site of aspiration of abscess Psych: Normal mood and affect.   Brief Hospital Course:  Jacob Benitez is 82 y.o. male with PMH of HTN, HLD, and CAD s/p stent who presented with MRI findings of vertebral osteomyelitis and spinal epidural abscess in the lower T spine. Patient reported 2-3 month history of back pain and is s/p cystotomy and prostatectomy after work up of this back pain. Back pain did not resolve after surgeries and therefore an MRI was obtained. He was admitted for management of the MRI findings.   IR performed disc aspiration on 3/24. ID was consulted for antibiotic recommendations. Patient was started on Vancomycin and Ceftriaxone on 3/24. Blood cultures had no growth at 72 hours so PICC line was placed. No organisms were present on culture from  disc aspiration.  Patient remained afebrile and without leukocytosis throughout hospitalization. He was stable for discharge with home health services to complete his course of antibiotics. ID plans to change antibiotics outpatient if pathogen is identified; otherwise, will continue with vancomycin and ceftriaxone.   Issues for Follow Up:  1. Discharged with PICC line in place. Will need to continue antibiotics through at least May 3rd (6 weeks) and will need follow up MRI.  2. Needs twice weekly BMP and vancomycin trough. Needs weekly CBC. Needs ESR and CRP every 2 weeks.  3. Oxycodone 5 mg #30 was given for pain at discharge. Further pain control per PCP.   Significant Procedures: Disc Aspiration   Significant Labs and Imaging:   Recent Labs Lab 10/29/15 1723 10/30/15 0400 10/31/15 0718  WBC 3.5* 5.5 3.7*  HGB 10.9* 9.3* 9.7*  HCT 34.1* 29.6* 31.0*  PLT 233 262 214    Recent Labs Lab 10/29/15 1723 10/30/15 0400 11/02/15 1143  NA 138 139  --   K 5.6* 5.1  --   CL 105 106  --   CO2 23 26  --   GLUCOSE 167* 112*  --   BUN 12 15  --   CREATININE 0.87 0.77 0.85  CALCIUM 9.5 8.9  --    CRP 1.5 ESR 90  Excerpt from MRI Report: There is fluid in disc space and destruction of the adjacent endplates at J00-93 compatible with disc osteomyelitis. Epidural collection extends 4 cm from the T10-11 level through the midbody of T12. The extends into both neural foramina at T11-12. There is diffuse abnormal marrow signal. Extensive heterogenous fluid extends into the paraspinous soft tissues at  the T11-12 level."  Results/Tests Pending at Time of Discharge: Final Abscess Culture Result   Discharge Medications:    Medication List    TAKE these medications        cefTRIAXone 2 g in dextrose 5 % 50 mL  Inject 2 g into the vein daily.     cyclobenzaprine 5 MG tablet  Commonly known as:  FLEXERIL  Take 5 mg by mouth every 12 (twelve) hours.     donepezil 10 MG tablet  Commonly  known as:  ARICEPT  Take 20 mg by mouth 2 (two) times daily.     gabapentin 100 MG capsule  Commonly known as:  NEURONTIN  Take 100 mg by mouth 3 (three) times daily.     ibuprofen 200 MG tablet  Commonly known as:  ADVIL,MOTRIN  Take 200 mg by mouth every 6 (six) hours as needed for moderate pain.     oxyCODONE 5 MG immediate release tablet  Commonly known as:  Oxy IR/ROXICODONE  Take 1 tablet (5 mg total) by mouth every 6 (six) hours as needed for moderate pain.     predniSONE 20 MG tablet  Commonly known as:  DELTASONE  Take 40 mg by mouth daily.     vancomycin 1 GM/200ML Soln  Commonly known as:  VANCOCIN  Inject 200 mLs (1,000 mg total) into the vein every 12 (twelve) hours.        Discharge Instructions: Please refer to Patient Instructions section of EMR for full details.  Patient was counseled important signs and symptoms that should prompt return to medical care, changes in medications, dietary instructions, activity restrictions, and follow up appointments.   Follow-Up Appointments: Follow-up Information    Follow up with [facility].   Why:  home health nurse for IV antibiotic administration and PICC line management   Contact information:   [place] [id number] [place], DO 11/03/2015, 9:33 PM PGY-1, [place]

## 2015-11-01 LAB — VANCOMYCIN, TROUGH: Vancomycin Tr: 12 ug/mL (ref 10.0–20.0)

## 2015-11-01 MED ORDER — DEXTROSE 5 % IV SOLN
2.0000 g | INTRAVENOUS | Status: DC
  Administered 2015-11-01 – 2015-11-02 (×2): 2 g via INTRAVENOUS
  Filled 2015-11-01 (×2): qty 2

## 2015-11-01 MED ORDER — VANCOMYCIN HCL IN DEXTROSE 1-5 GM/200ML-% IV SOLN
1000.0000 mg | Freq: Two times a day (BID) | INTRAVENOUS | Status: DC
  Administered 2015-11-02 (×2): 1000 mg via INTRAVENOUS
  Filled 2015-11-01 (×3): qty 200

## 2015-11-01 MED ORDER — OXYCODONE HCL 5 MG PO TABS: 5.0000 mg | ORAL_TABLET | Freq: Four times a day (QID) | ORAL | Status: DC | PRN

## 2015-11-01 MED ORDER — DEXTROSE 5 % IV SOLN: 2.0000 g | INTRAVENOUS | Status: DC

## 2015-11-01 MED ORDER — VANCOMYCIN HCL 1000 MG IV SOLR: 750.0000 mg | Freq: Two times a day (BID) | INTRAVENOUS | Status: DC

## 2015-11-01 MED ORDER — POLYETHYLENE GLYCOL 3350 17 G PO PACK
17.0000 g | PACK | Freq: Every day | ORAL | Status: DC
  Administered 2015-11-01 – 2015-11-02 (×2): 17 g via ORAL
  Filled 2015-11-01 (×2): qty 1

## 2015-11-01 NOTE — Progress Notes (Signed)
Utilization review completed.  

## 2015-11-01 NOTE — Progress Notes (Signed)
INTERVAL NOTE  Called pharmacy concerning vancomycin dosing on discharge. Patient has a vanc trough due this evening. After discussing with attending, patient seems okay staying another night. Will placed order of PICC line at 1730 if blood cultures are still negative. Plan for discharge home 3/27. Patient will need PRINTED prescriptions of antibiotics with the dosage, frequency, start date, and stop date on them. Patient also requires follow up with ID.     Joanna Puffrystal S. Girolamo Lortie, MD [facility] Resident  11/01/2015, 2:14 PM

## 2015-11-01 NOTE — Progress Notes (Signed)
Patient ID: Jacob Benitez, male   DOB: 07/25/1930, 82 y.o.   MRN: [id number]         [facility]    Date of Admission:  10/29/2015   Total days of antibiotics 3         Lumbar aspirate and blood cultures remain negative. I will continue vancomycin and ceftriaxone pending final culture results.         Cliffton AstersJohn Cooper Moroney, MD [facility] [phone] pager   [phone] cell 08/11/2015, 1:32 PM

## 2015-11-01 NOTE — Care Management Note (Signed)
Case Management Note  Patient Details  Name: Jacob Benitez MRN: [id number] Date of Birth: [birth year]  Subjective/Objective:                  Vertebral osteomyelitis Action/Plan: Discharge planning Expected Discharge Date:  11/02/15               Expected Discharge Plan:  [place]  In-House Referral:     Discharge planning Services  CM Consult  Post Acute Care Choice:  Home Health Choice offered to:  Patient, Spouse, Adult Children  DME Arranged:  IV pump/equipment DME Agency:  [place]:  RN [facility] Agency:  [place]  Status of Service:  In process, will continue to follow  Medicare Important Message Given:    Date Medicare IM Given:    Medicare IM give by:    Date Additional Medicare IM Given:    Additional Medicare Important Message give by:     If discussed at [place] of Stay Meetings, dates discussed:    Additional Comments: CM met with pt,pt's spouse, and pt's daughter, Shelba Flake [phone].  Pt is very HOH but has a clear understanding of the plan for home IV ABX.  Family requests Marcie Bal to be primary contact as pt is [facility] for scheduling purposes.  Referral called to [facility] with SSN and PCP: Charletta Cousin, MD.  PICC placement pending.  Plan is for discharge tomorrow 11/02/15 and Marcie Bal requests late day discharge as she and other family members are working.  CM will continue to follow.   Dellie Catholic, RN 11/01/2015, 2:28 PM

## 2015-11-01 NOTE — Progress Notes (Signed)
Pharmacy Antibiotic Note  Jacob Benitez is a 82 y.o. male admitted on 10/29/2015 with Vertebral osteomyelitis .  Pharmacy has been consulted for vancomycin dosing. -vancomycin level today= 12 at ~ 5pm; on vanc 750mg  IV q12h   Plan:  -Change vancomycin to 1000mg  IV q12h -Will follow renal function, cultures and clinical progress   Height: 5\' 11"  (180.3 cm) Weight: 145 lb 1 oz (65.8 kg) IBW/kg (Calculated) : 75.3  Temp (24hrs), Avg:97.8 F (36.6 C), Min:97.7 F (36.5 C), Max:97.9 F (36.6 C)   Recent Labs Lab 10/29/15 1723 10/30/15 0400 10/31/15 0718 11/01/15 1656  WBC 3.5* 5.5 3.7*  --   CREATININE 0.87 0.77  --   --   VANCOTROUGH  --   --   --  12    Estimated Creatinine Clearance: 65.1 mL/min (by C-G formula based on Cr of 0.77).    No Known Allergies  Antimicrobials this admission: 3/24 Vanc>>        3/36: VT= 12 3/24 Ceftriaxone>>   Microbiology results: 3/23 BCx2>> ngtd 3/24 Abscess>>  Thank you for allowing pharmacy to be a part of this patient's care.  Harland GermanAndrew Jacquese Hackman, Pharm D 11/01/2015 6:15 PM

## 2015-11-01 NOTE — Progress Notes (Signed)
Family Medicine Teaching Service Daily Progress Note Intern Pager: [phone]  Patient name: Jacob Benitez Medical record number: [id number] Date of birth: Jun 27, 1930 Age: 82 y.o. Gender: male  Primary Care Provider: No primary care provider on file. Consultants: IR, ID  Code Status: FULL   Pt Overview and Major Events to Date:  3/23: Admitted with vertebral osteomyelitis and spinal epidural abscess   Assessment and Plan: 82 y.o. male presenting with back pain secondary to vertebral osteomyelitis and spinal epidural abscess. PMH is significant for HTN, HLD, CAD s/p stent, and s/p cystotomy and prostatectomy.  # Vertebral Osteomyelitis and Spinal Epidural Abscess: MRI with fluid in disc space and destruction of the adjacent endplates at T51-76 compatible with disc osteomyelitis; epidural collection extends 4 cm from the T10-11 level through the midbody of T12 and into both neural foramina at T11-12. CRP elevated to 1.5 and ESR elevated to 90. IR performed disc aspiration on 3/24. Gram stain of epidural abscess without orgnanisms. - Afebrile - ID consulted, appreciate recommendations. Recommend Vancomycin and Ceftriaxone. PICC if blood cultures negative at 72hr (Today @ 1707) - Follow up blood cultures. NGTD. Collected 3/23 @ 1707. - Vancomycin (3/24>>), Ceftriaxone (3/24>>) - [facility] able to give [facility] and home infusion ABX if discharged home - Tylenol PRN for mild pain, oxycodone PRN moderate pain  # HTN: Range 149-164/69-70, most recent 164/72 - No medications at this time - Continue to monitor  FEN/GI: Regular Diet Prophylaxis: Heparin, holding for possible sx   Disposition: Home pending IR/ID recs for vertebral osteo and spinal abscess   Subjective:  Notes improvement in pain. Still hasn't had bowel movement and requests additional medication. Would like to go home today since family is off work, however understands discharge may be later this week.  Objective: Temp:  [97.8 F  (36.6 C)-97.9 F (36.6 C)] 97.9 F (36.6 C) (03/25 2248) Pulse Rate:  [69-70] 70 (03/25 2248) Resp:  [18] 18 (03/25 2248) BP: (149-164)/(54-72) 164/72 mmHg (03/25 2248) SpO2:  [97 %-100 %] 97 % (03/25 2248) Physical Exam: General: Elderly male lying in bed, non-toxic, NAD  ENTM:  Hard of hearing.  Neck: Full ROM. No rigidity.  Cardiovascular: RRR. No murmurs appreciated. 1+ pedal pulses bilaterally.  Respiratory: CTAB. Normal WOB.  Abdomen: +BS, soft, NTND MSK: Moves all extremities spontaneously.Spine non-tender to palpation.  Neuro: Alert. Sensation grossly intact. Skin: bandaid in place over site of aspiration of abscess Psych: Normal mood and affect.   Laboratory:  Recent Labs Lab 10/29/15 1723 10/30/15 0400 10/31/15 0718  WBC 3.5* 5.5 3.7*  HGB 10.9* 9.3* 9.7*  HCT 34.1* 29.6* 31.0*  PLT 233 262 214    Recent Labs Lab 10/29/15 1723 10/30/15 0400  NA 138 139  K 5.6* 5.1  CL 105 106  CO2 23 26  BUN 12 15  CREATININE 0.87 0.77  CALCIUM 9.5 8.9  GLUCOSE 167* 112*  - CRP 1.5 - ESR 90  Imaging/Diagnostic Tests: Excerpt from MRI Report: There is fluid in disc space and destruction of the adjacent endplates at H60-73 compatible with disc osteomyelitis. Epidural collection extends 4 cm from the T10-11 level through the midbody of T12. The extends into both neural foramina at T11-12. There is diffuse abnormal marrow signal. Extensive heterogenous fluid extends into the paraspinous soft tissues at the T11-12 level."  Lorna Few, DO 11/01/2015, 7:08 AM PGY-2, [place] Intern pager: [phone], text pages welcome

## 2015-11-02 DIAGNOSIS — M4624 Osteomyelitis of vertebra, thoracic region: Principal | ICD-10-CM

## 2015-11-02 DIAGNOSIS — Z9889 Other specified postprocedural states: Secondary | ICD-10-CM

## 2015-11-02 LAB — CREATININE, SERUM
Creatinine, Ser: 0.85 mg/dL (ref 0.61–1.24)
GFR calc Af Amer: >60 mL/min (ref >60)
GFR calc non Af Amer: >60 mL/min (ref >60)

## 2015-11-02 MED ORDER — SODIUM CHLORIDE 0.9% FLUSH: 10.0000 mL | INTRAVENOUS | Status: DC | PRN

## 2015-11-02 MED ORDER — VANCOMYCIN HCL IN DEXTROSE 1-5 GM/200ML-% IV SOLN
1000.0000 mg | Freq: Two times a day (BID) | INTRAVENOUS | Status: AC
Start: 2015-11-02 — End: 2015-12-09

## 2015-11-02 MED ORDER — DEXTROSE 5 % IV SOLN: 2.0000 g | INTRAVENOUS | Status: DC

## 2015-11-02 NOTE — Progress Notes (Signed)
[facility] for Infectious Disease   Reason for visit: Follow up on discitis  Interval History: continues to have pain, afebrile, culture still incubating.   Physical Exam: Constitutional:  Filed Vitals:   11/02/15 0524 11/02/15 1300  BP: 135/63 131/67  Pulse: 94 96  Temp: 97.8 F (36.6 C) 97.5 F (36.4 C)  Resp: 16 16   patient appears some distress with pain Respiratory: Normal respiratory effort; CTA B Cardiovascular: RRR  Review of Systems: Respiratory: negative for cough Gastrointestinal: negative for nausea, vomiting and diarrhea Integument/breast: negative for rash  Lab Results  Component Value Date   WBC 3.7* 10/31/2015   HGB 9.7* 10/31/2015   HCT 31.0* 10/31/2015   MCV 90.6 10/31/2015   PLT 214 10/31/2015    Lab Results  Component Value Date   CREATININE 0.85 11/02/2015   BUN 15 10/30/2015   NA 139 10/30/2015   K 5.1 10/30/2015   CL 106 10/30/2015   CO2 26 10/30/2015   No results found for: ALT, AST, GGT, ALKPHOS   Microbiology: Recent Results (from the past 240 hour(s))  Blood culture (routine x 2)     Status: None (Preliminary result)   Collection Time: 10/29/15  4:58 PM  Result Value Ref Range Status   Specimen Description BLOOD RIGHT ANTECUBITAL  Final   Special Requests BOTTLES DRAWN AEROBIC AND ANAEROBIC 5CC  Final   Culture NO GROWTH 4 DAYS  Final   Report Status PENDING  Incomplete  Blood culture (routine x 2)     Status: None (Preliminary result)   Collection Time: 10/29/15  5:07 PM  Result Value Ref Range Status   Specimen Description BLOOD LEFT ANTECUBITAL  Final   Special Requests BOTTLES DRAWN AEROBIC AND ANAEROBIC 5CC  Final   Culture NO GROWTH 4 DAYS  Final   Report Status PENDING  Incomplete  Stat Gram stain     Status: None   Collection Time: 10/30/15  2:28 PM  Result Value Ref Range Status   Specimen Description ABSCESS  Final   Special Requests T11 T12  Final   Gram Stain   Final    RARE WBC PRESENT, PREDOMINANTLY  PMN NO ORGANISMS SEEN RESULT CALLED TO, READ BACK BY AND VERIFIED WITH: K ENIOLA 10/30/15 @ 1509 M VESTAL    Report Status 10/30/2015 FINAL  Final  Culture, routine-abscess     Status: None (Preliminary result)   Collection Time: 10/30/15  2:31 PM  Result Value Ref Range Status   Specimen Description ABSCESS  Final   Special Requests T11 T12  Final   Gram Stain   Final    FEW WBC PRESENT,BOTH PMN AND MONONUCLEAR NO SQUAMOUS EPITHELIAL CELLS SEEN NO ORGANISMS SEEN Performed at [employer]   Final    Culture reincubated for better growth Performed at [facility]    Report Status PENDING  Incomplete    Impression/Plan:  1. Discitis/osteomyelitis of T11-12 s/p aspiration - culture still with no growth.  Will change as an outpatient if a pathogen is IDed after discharge, otherwise will continue with vancomycin and ceftriaxone for 6 weeks (at least) through May 3rd per home health protocol including twice weekly bmp, vancomycin trough, weekly cbc and ESR and CRP every 2 weeks.  Baseline ESR 90, CRP 1.5  2. Pain - will need continued pain control as outpatient per his PCP

## 2015-11-02 NOTE — Care Management Important Message (Signed)
Important Message  Patient Details  Name: Jacob AusWalter Leroy Hard MRN: [id number] Date of Birth: 10/17/1929   Medicare Important Message Given:  Yes    Nialah Saravia, Stephan MinisterSusan Coleman 11/02/2015, 1:00 PM

## 2015-11-02 NOTE — Progress Notes (Signed)
Family Medicine Teaching Service Daily Progress Note Intern Pager: [phone]  Patient name: Jacob Benitez Medical record number: [id number] Date of birth: [birth year] Age: 82 y.o. Gender: male  Primary Care Provider: No primary care provider on file. Consultants: IR, ID  Code Status: FULL   Pt Overview and Major Events to Date:  3/23: Admitted with vertebral osteomyelitis and spinal epidural abscess   Assessment and Plan: 82 y.o. male presenting with back pain secondary to vertebral osteomyelitis and spinal epidural abscess. PMH is significant for HTN, HLD, CAD s/p stent, and s/p cystotomy and prostatectomy.  # Vertebral Osteomyelitis and Spinal Epidural Abscess: MRI with fluid in disc space and destruction of the adjacent endplates at U27-25 compatible with disc osteomyelitis; epidural collection extends 4 cm from the T10-11 level through the midbody of T12 and into both neural foramina at T11-12. CRP elevated to 1.5 and ESR elevated to 90. IR performed disc aspiration on 3/24. Gram stain of epidural abscess without orgnanisms. - Afebrile - ID consulted, appreciate recommendations. Recommend Vancomycin and Ceftriaxone. PICC placed on 3/27 because blood cx neg >72 hours  - Follow up blood cultures. NGTD. Collected 3/23 @ 1707. - Vancomycin (3/24>>), Ceftriaxone (3/24>>) - [facility] able to give [facility] and home infusion ABX if discharged home - Tylenol PRN for mild pain, oxycodone PRN moderate pain  # HTN: Range 149-164/69-70, most recent 164/72 - No medications at this time - Continue to monitor  FEN/GI: Regular Diet Prophylaxis: Heparin, holding for possible sx   Disposition: Home today pending arrangement of IV abx and placement of PICC   Subjective:  Concerned that back pain will not resolve. 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   Objective: Temp:  [97.7 F (36.5 C)-98.2 F (36.8 C)] 97.8 F (36.6 C) (03/27 0524) Pulse Rate:  [65-94] 94 (03/27 0524) Resp:  [16-18] 16  (03/27 0524) BP: (135-151)/(63-84) 135/63 mmHg (03/27 0524) SpO2:  [96 %-99 %] 99 % (03/27 0524) Physical Exam: General: Elderly male sitting on side of bed, non-toxic, NAD  ENTM:  Hard of hearing.  Neck: Full ROM. No rigidity.  Cardiovascular: RRR. No murmurs appreciated. 1+ pedal pulses bilaterally.  Respiratory: CTAB. Normal WOB.  Abdomen: +BS, soft, NTND MSK: Moves all extremities spontaneously.Spine non-tender to palpation.  Neuro: Alert. Sensation grossly intact. Skin: bandaid in place over site of aspiration of abscess Psych: Normal mood and affect.   Laboratory:  Recent Labs Lab 10/29/15 1723 10/30/15 0400 10/31/15 0718  WBC 3.5* 5.5 3.7*  HGB 10.9* 9.3* 9.7*  HCT 34.1* 29.6* 31.0*  PLT 233 262 214    Recent Labs Lab 10/29/15 1723 10/30/15 0400  NA 138 139  K 5.6* 5.1  CL 105 106  CO2 23 26  BUN 12 15  CREATININE 0.87 0.77  CALCIUM 9.5 8.9  GLUCOSE 167* 112*  - CRP 1.5 - ESR 90  Imaging/Diagnostic Tests: Excerpt from MRI Report: There is fluid in disc space and destruction of the adjacent endplates at D66-44 compatible with disc osteomyelitis. Epidural collection extends 4 cm from the T10-11 level through the midbody of T12. The extends into both neural foramina at T11-12. There is diffuse abnormal marrow signal. Extensive heterogenous fluid extends into the paraspinous soft tissues at the T11-12 level."  Nicolette Bang, DO 11/02/2015, 11:01 AM PGY-2, [place] Intern pager: [phone], text pages welcome

## 2015-11-02 NOTE — Progress Notes (Signed)
[facility]  Per MD and weekend case manager pt has now decided to DC to home.  [facility] will provide in hospital teaching with wife and/or daughter to ensure family is able to manage IV ABX at home following hospital and in home education.  [facility] will need IV ABX scripts and will then be set for DC to home to support home care needs.   If patient discharges after hours, please call [phone].   Jacob Benitez 11/02/2015, 6:43 AM

## 2015-11-03 LAB — CULTURE, BLOOD (ROUTINE X 2)
Culture: NO GROWTH
Culture: NO GROWTH

## 2015-11-04 ENCOUNTER — Telehealth: Payer: Self-pay | Admitting: *Deleted

## 2015-11-04 LAB — CULTURE, ROUTINE-ABSCESS

## 2015-11-04 NOTE — Telephone Encounter (Signed)
-----   Message from Gardiner Barefootobert W Comer, MD sent at 11/03/2015  3:16 PM EDT ----- Could you do me a favor and let [facility] know this patient now only needs the vancomycin and can stop the ceftriaxone.  Thanks

## 2015-11-04 NOTE — Telephone Encounter (Signed)
Verbal order per Dr. Linus Salmons to Vinnie Level at [facility] to stop ceftriaxone, but continue the vancomycin.   Per last hospital note, stop date through 5/3 (at least).  Lab orders are twice weekly bmp, vancomycin trough, weekly cbc and ESR and CRP every 2 weeks.  Per Vinnie Level, the patient is being switched to q24 hour vancomycin dosing today with the next medication delivery, as the patient and his wife both have dementia and family members can only come once daily for medication administration. They will watch troughs per protocol after the dose change. Landis Gandy, RN

## 2015-11-04 NOTE — Telephone Encounter (Signed)
We actually can use ampicillin instead.  Sensitivities just came back, which is just changing the cartridge once a day.  Can finish off whatever vacomycin they have left first.  Ampicillin 2 grams every 4 hours.  Thanks!

## 2015-11-05 NOTE — Telephone Encounter (Signed)
Thanks

## 2015-11-05 NOTE — Telephone Encounter (Signed)
May be cost prohibitive to switch to ampicillin.  Patient's copay goes from $60/week to $320/week. Pharmacy also concerned about patient's ability to handle the pump (and potential alarms) for ampicillin.    If you prefer for him to stay on vancomycin, the patient's daughter and son have worked out a schedule to go daily to the patient's home to administer the meds.

## 2015-11-05 NOTE — Telephone Encounter (Signed)
Gave verbal orders to Pharmacy to continue vancomycin and labs as ordered. Thanks!

## 2015-11-05 NOTE — Telephone Encounter (Signed)
Vancomycin works fine.  thanks

## 2015-11-05 NOTE — Telephone Encounter (Signed)
Relayed verbal order per Dr. Linus Salmons to Je[facility] Hawking at [facility].  Unsure if the patient has ever been on ampicillin before, [facility] will send an anaphylaxis kit when they send the new medication.

## 2015-12-01 ENCOUNTER — Ambulatory Visit (INDEPENDENT_AMBULATORY_CARE_PROVIDER_SITE_OTHER): Payer: Medicare Other | Admitting: Internal Medicine

## 2015-12-01 ENCOUNTER — Encounter: Payer: Self-pay | Admitting: Internal Medicine

## 2015-12-01 ENCOUNTER — Telehealth: Payer: Self-pay | Admitting: *Deleted

## 2015-12-01 VITALS — BP 178/77 | HR 81 | Temp 98.0°F | Ht 70.0 in | Wt 153.0 lb

## 2015-12-01 DIAGNOSIS — G061 Intraspinal abscess and granuloma: Secondary | ICD-10-CM

## 2015-12-01 DIAGNOSIS — M4620 Osteomyelitis of vertebra, site unspecified: Secondary | ICD-10-CM

## 2015-12-01 DIAGNOSIS — M549 Dorsalgia, unspecified: Secondary | ICD-10-CM | POA: Insufficient documentation

## 2015-12-01 DIAGNOSIS — M545 Low back pain, unspecified: Secondary | ICD-10-CM

## 2015-12-01 HISTORY — DX: Dorsalgia, unspecified: M54.9

## 2015-12-01 NOTE — Assessment & Plan Note (Signed)
He is going to discuss better control with his PCP.

## 2015-12-01 NOTE — Telephone Encounter (Signed)
Patient's daughter and emergency contact notified of [place] appt [place] in [place] on Friday 12/04/15 at 10:30 AM. Patient was also notified. Address is [address] [place]. Fax # given and results are to be faxed to Dr. Luciana Axeomer.

## 2015-12-01 NOTE — Assessment & Plan Note (Signed)
I am going to recheck his MRI to see if it is resolved prior to stopping.  His CRP and ESR has improved.  If ok, will stop otherwise may consider oral therapy.

## 2015-12-01 NOTE — Progress Notes (Signed)
   Subjective:    Patient ID: Jacob Benitez, male    DOB: 02/15/1930, 82 y.o.   MRN: [id number]  HPI Here for follow up of epidural abscess.   Jacob Benitez is a 82 y.o. male with CAD, HTN who previously fell off of a ladder. He had continued back pain but kept hoping it would improve but didn't. He then went to his PCP and had MRI notable for T11-12 abscess/disc osteomyelitis and admitted and biopsy revealed Enterococcus. He was started on vancomycin initially and continued with that due to ease of dosing at home, compared to ampicillin.  His son and daughter in law take care of it. He tells me his pain persists and wants it to go away.  He does not understand why it is not better.  No fever, no chills, no diarrhea.    Review of Systems  Constitutional: Negative for fever, chills and appetite change.  Gastrointestinal: Negative for nausea and diarrhea.  Skin: Negative for rash.       Objective:   Physical Exam  Constitutional: He appears well-developed and well-nourished.  Confused with discussion, repeat similar questions, does not recall all information I discuss with him.   HENT:  Mouth/Throat: No oropharyngeal exudate.  Cardiovascular: Normal rate, regular rhythm and normal heart sounds.   No murmur heard. Lymphadenopathy:    He has no cervical adenopathy.  Skin: No rash noted.          Assessment & Plan:

## 2015-12-09 ENCOUNTER — Telehealth: Payer: Self-pay | Admitting: *Deleted

## 2015-12-09 NOTE — Telephone Encounter (Signed)
Patient's daughter asking for MRI results (were supposed to be faxed to [facility]).  Please advise on these results. Patient is still in pain. Daughter Janet's cell: [phone]. Andree CossHowell, Iokepa Geffre M, RN

## 2015-12-10 NOTE — Telephone Encounter (Signed)
I have not seen the MRI, can we get a copy?  thanks

## 2015-12-11 ENCOUNTER — Encounter: Payer: Self-pay | Admitting: Internal Medicine

## 2015-12-11 NOTE — Telephone Encounter (Signed)
Results requested/received today, placed in your box.  IV antibiotics extended 2 weeks through 5/19 per Dr. Ephriam Knucklesomer's verbal order [facility]. Family notified. Thanks!

## 2015-12-21 ENCOUNTER — Telehealth: Payer: Self-pay | Admitting: *Deleted

## 2015-12-21 NOTE — Telephone Encounter (Signed)
Shared Dr. Ephriam Knucklesomer's message with [facility] to share with RN.

## 2015-12-21 NOTE — Telephone Encounter (Signed)
-----   Message from Gardiner Barefootobert W Comer, MD sent at 12/21/2015  3:03 PM EDT ----- His labs continue to look good and with the MRI results being good, he can stop the antbiotics on 5/19 as is already programmed.  It was extended since he was still in considerable pain.  If the pain has continued, we can send him to a back doctor (ortho/neurosurg) though I don't thin they will do surgery.  Other option would be physiatrist if to help with pain control.   If you could let [facility] know to pull the picc when done.  thanks

## 2016-01-01 ENCOUNTER — Encounter: Payer: Self-pay | Admitting: Internal Medicine

## 2016-06-29 DIAGNOSIS — G301 Alzheimer's disease with late onset: Secondary | ICD-10-CM

## 2016-06-29 DIAGNOSIS — R5383 Other fatigue: Secondary | ICD-10-CM

## 2016-06-29 DIAGNOSIS — R55 Syncope and collapse: Secondary | ICD-10-CM | POA: Insufficient documentation

## 2016-06-29 DIAGNOSIS — H9193 Unspecified hearing loss, bilateral: Secondary | ICD-10-CM | POA: Insufficient documentation

## 2016-06-29 DIAGNOSIS — E1122 Type 2 diabetes mellitus with diabetic chronic kidney disease: Secondary | ICD-10-CM

## 2016-06-29 DIAGNOSIS — F411 Generalized anxiety disorder: Secondary | ICD-10-CM | POA: Insufficient documentation

## 2016-06-29 DIAGNOSIS — I951 Orthostatic hypotension: Secondary | ICD-10-CM

## 2016-06-29 DIAGNOSIS — Z79899 Other long term (current) drug therapy: Secondary | ICD-10-CM | POA: Insufficient documentation

## 2016-06-29 DIAGNOSIS — R5381 Other malaise: Secondary | ICD-10-CM | POA: Insufficient documentation

## 2016-06-29 DIAGNOSIS — N183 Chronic kidney disease, stage 3 unspecified: Secondary | ICD-10-CM

## 2016-06-29 DIAGNOSIS — E782 Mixed hyperlipidemia: Secondary | ICD-10-CM

## 2016-06-29 DIAGNOSIS — E559 Vitamin D deficiency, unspecified: Secondary | ICD-10-CM

## 2016-06-29 DIAGNOSIS — F0280 Dementia in other diseases classified elsewhere without behavioral disturbance: Secondary | ICD-10-CM | POA: Insufficient documentation

## 2016-06-29 DIAGNOSIS — R251 Tremor, unspecified: Secondary | ICD-10-CM | POA: Insufficient documentation

## 2016-06-29 HISTORY — DX: Mixed hyperlipidemia: E78.2

## 2016-06-29 HISTORY — DX: Orthostatic hypotension: I95.1

## 2016-06-29 HISTORY — DX: Unspecified hearing loss, bilateral: H91.93

## 2016-06-29 HISTORY — DX: Other long term (current) drug therapy: Z79.899

## 2016-06-29 HISTORY — DX: Dementia in other diseases classified elsewhere, unspecified severity, without behavioral disturbance, psychotic disturbance, mood disturbance, and anxiety: F02.80

## 2016-06-29 HISTORY — DX: Generalized anxiety disorder: F41.1

## 2016-06-29 HISTORY — DX: Type 2 diabetes mellitus with diabetic chronic kidney disease: E11.22

## 2016-06-29 HISTORY — DX: Syncope and collapse: R55

## 2016-06-29 HISTORY — DX: Other malaise: R53.81

## 2016-06-29 HISTORY — DX: Vitamin D deficiency, unspecified: E55.9

## 2016-06-29 HISTORY — DX: Tremor, unspecified: R25.1

## 2016-06-29 HISTORY — DX: Alzheimer's disease with late onset: G30.1

## 2016-06-29 HISTORY — DX: Chronic kidney disease, stage 3 unspecified: N18.30

## 2016-06-30 DIAGNOSIS — N400 Enlarged prostate without lower urinary tract symptoms: Secondary | ICD-10-CM | POA: Insufficient documentation

## 2016-06-30 HISTORY — DX: Benign prostatic hyperplasia without lower urinary tract symptoms: N40.0

## 2017-04-12 DIAGNOSIS — Z955 Presence of coronary angioplasty implant and graft: Secondary | ICD-10-CM | POA: Insufficient documentation

## 2017-04-12 HISTORY — DX: Presence of coronary angioplasty implant and graft: Z95.5

## 2017-12-07 DIAGNOSIS — L570 Actinic keratosis: Secondary | ICD-10-CM

## 2017-12-07 HISTORY — DX: Actinic keratosis: L57.0

## 2018-02-26 ENCOUNTER — Encounter: Payer: Self-pay | Admitting: Cardiology

## 2018-02-26 ENCOUNTER — Ambulatory Visit: Payer: Medicare Other | Admitting: Cardiology

## 2018-02-26 VITALS — BP 144/72 | HR 54 | Ht 70.0 in | Wt 177.0 lb

## 2018-02-26 DIAGNOSIS — I10 Essential (primary) hypertension: Secondary | ICD-10-CM | POA: Diagnosis not present

## 2018-02-26 DIAGNOSIS — I483 Typical atrial flutter: Secondary | ICD-10-CM

## 2018-02-26 DIAGNOSIS — E1122 Type 2 diabetes mellitus with diabetic chronic kidney disease: Secondary | ICD-10-CM

## 2018-02-26 DIAGNOSIS — N183 Chronic kidney disease, stage 3 unspecified: Secondary | ICD-10-CM

## 2018-02-26 DIAGNOSIS — E782 Mixed hyperlipidemia: Secondary | ICD-10-CM | POA: Diagnosis not present

## 2018-02-26 DIAGNOSIS — I4892 Unspecified atrial flutter: Secondary | ICD-10-CM | POA: Insufficient documentation

## 2018-02-26 DIAGNOSIS — E7800 Pure hypercholesterolemia, unspecified: Secondary | ICD-10-CM

## 2018-02-26 DIAGNOSIS — I2510 Atherosclerotic heart disease of native coronary artery without angina pectoris: Secondary | ICD-10-CM

## 2018-02-26 HISTORY — DX: Unspecified atrial flutter: I48.92

## 2018-02-26 LAB — TSH: TSH: 2.79 u[IU]/mL (ref 0.450–4.500)

## 2018-02-26 LAB — HEPATIC FUNCTION PANEL
ALBUMIN: 4.1 g/dL (ref 3.5–4.7)
ALT: 18 IU/L (ref 0–44)
AST: 20 IU/L (ref 0–40)
Alkaline Phosphatase: 64 IU/L (ref 39–117)
BILIRUBIN TOTAL: 0.3 mg/dL (ref 0.0–1.2)
Bilirubin, Direct: 0.12 mg/dL (ref 0.00–0.40)
TOTAL PROTEIN: 6.2 g/dL (ref 6.0–8.5)

## 2018-02-26 LAB — CBC WITH DIFFERENTIAL/PLATELET
BASOS ABS: 0 10*3/uL (ref 0.0–0.2)
Basos: 1 %
EOS (ABSOLUTE): 0.4 10*3/uL (ref 0.0–0.4)
Eos: 8 %
HEMOGLOBIN: 14.1 g/dL (ref 13.0–17.7)
Hematocrit: 41.4 % (ref 37.5–51.0)
IMMATURE GRANS (ABS): 0 10*3/uL (ref 0.0–0.1)
IMMATURE GRANULOCYTES: 0 %
LYMPHS: 29 %
Lymphocytes Absolute: 1.6 10*3/uL (ref 0.7–3.1)
MCH: 32.4 pg (ref 26.6–33.0)
MCHC: 34.1 g/dL (ref 31.5–35.7)
MCV: 95 fL (ref 79–97)
Monocytes Absolute: 0.5 10*3/uL (ref 0.1–0.9)
Monocytes: 9 %
NEUTROS PCT: 53 %
Neutrophils Absolute: 2.9 10*3/uL (ref 1.4–7.0)
PLATELETS: 156 10*3/uL (ref 150–450)
RBC: 4.35 x10E6/uL (ref 4.14–5.80)
RDW: 13.9 % (ref 12.3–15.4)
WBC: 5.5 10*3/uL (ref 3.4–10.8)

## 2018-02-26 LAB — LIPID PANEL
CHOL/HDL RATIO: 3.5 ratio (ref 0.0–5.0)
Cholesterol, Total: 195 mg/dL (ref 100–199)
HDL: 55 mg/dL (ref >39)
LDL CALC: 107 mg/dL — AB (ref 0–99)
Triglycerides: 163 mg/dL — ABNORMAL HIGH (ref 0–149)
VLDL CHOLESTEROL CAL: 33 mg/dL (ref 5–40)

## 2018-02-26 LAB — BASIC METABOLIC PANEL
BUN/Creatinine Ratio: 13 (ref 10–24)
BUN: 16 mg/dL (ref 8–27)
CALCIUM: 9.8 mg/dL (ref 8.6–10.2)
CHLORIDE: 105 mmol/L (ref 96–106)
CO2: 24 mmol/L (ref 20–29)
Creatinine, Ser: 1.23 mg/dL (ref 0.76–1.27)
GFR calc Af Amer: 61 mL/min/{1.73_m2} (ref >59)
GFR calc non Af Amer: 53 mL/min/{1.73_m2} — ABNORMAL LOW (ref >59)
Glucose: 103 mg/dL — ABNORMAL HIGH (ref 65–99)
POTASSIUM: 5.6 mmol/L — AB (ref 3.5–5.2)
Sodium: 142 mmol/L (ref 134–144)

## 2018-02-26 NOTE — Progress Notes (Signed)
Cardiology Office Note:    Date:  02/26/2018   ID:  Jacob Benitez, DOB 1930-04-11, MRN [id number]  PCP:  Doreene Eland, MD  Cardiologist:  Garwin Brothers, MD   Referring MD: Doreene Eland, MD    ASSESSMENT:    1. CAD in native artery   2. Essential (primary) hypertension   3. Type 2 diabetes mellitus with stage 3 chronic kidney disease, without long-term current use of insulin (HCC)   4. Pure hypercholesterolemia   5. Mixed hyperlipidemia    PLAN:    In order of problems listed above:  1. Secondary prevention stressed with the patient.  Importance of compliance with diet and medications stressed and he vocalized understanding.  His blood pressure is stable.  He tells me that it is up at the doctor's office because of whitecoat hypertension. 2. His blood work will be checked today including Chem-7 liver lipid. 3. I mentioned to him about atrial flutter and anticoagulation.  He tells me that because of his frequent falls and forgetfulness he would rather not be on that medicine.  He was advised to take a full-strength aspirin. 4. Benefits and risks of anticoagulation were discussed and he vocalized understanding and questions were answered to his satisfaction. 5. Patient will be seen in follow-up appointment in 6 months or earlier if the patient has any concerns    Medication Adjustments/Labs and Tests Ordered: Current medicines are reviewed at length with the patient today.  Concerns regarding medicines are outlined above.  No orders of the defined types were placed in this encounter.  No orders of the defined types were placed in this encounter.    No chief complaint on file.    History of Present Illness:    Jacob Benitez is a 84 y.o. male.  He has past medical history of coronary artery disease, essential hypertension and dyslipidemia.  He does not remember the list of his medications and has not brought his medications.  He lives by himself his  children live with him.  He tells me that he is fallen a time or 2.  He tells me that he is getting significantly increasingly forgetful.  No chest pain orthopnea or PND.  At the time of my evaluation, the patient is alert awake oriented and in no distress.  This patient has been under my care in my previous practice.  He is here now to transfer his care and be established with my current practice. Past Medical History:  Diagnosis Date  . Hypercholesteremia   . Hypertension     Past Surgical History:  Procedure Laterality Date  . CORONARY STENT PLACEMENT    . PROCTECTOMY      Current Medications: Current Meds  Medication Sig  . donepezil (ARICEPT) 10 MG tablet Take 20 mg by mouth 2 (two) times daily.  Marland Kitchen gabapentin (NEURONTIN) 100 MG capsule Take 100 mg by mouth 3 (three) times daily.  Marland Kitchen ibuprofen (ADVIL,MOTRIN) 200 MG tablet Take 200 mg by mouth every 6 (six) hours as needed for moderate pain.  . predniSONE (DELTASONE) 20 MG tablet Take 40 mg by mouth daily.     Allergies:   Patient has no known allergies.   Social History   Socioeconomic History  . Marital status: Single    Spouse name: Not on file  . Number of children: Not on file  . Years of education: Not on file  . Highest education level: Not on file  Occupational History  .  Not on file  Social Needs  . Financial resource strain: Not on file  . Food insecurity:    Worry: Not on file    Inability: Not on file  . Transportation needs:    Medical: Not on file    Non-medical: Not on file  Tobacco Use  . Smoking status: Former Smoker    Packs/day: 1.00    Years: 20.00    Pack years: 20.00    Types: Cigarettes    Last attempt to quit: 10/29/1978    Years since quitting: 39.3  . Smokeless tobacco: Never Used  Substance and Sexual Activity  . Alcohol use: No    Alcohol/week: 0.0 oz  . Drug use: No  . Sexual activity: Not on file  Lifestyle  . Physical activity:    Days per week: Not on file    Minutes per  session: Not on file  . Stress: Not on file  Relationships  . Social connections:    Talks on phone: Not on file    Gets together: Not on file    Attends religious service: Not on file    Active member of club or organization: Not on file    Attends meetings of clubs or organizations: Not on file    Relationship status: Not on file  Other Topics Concern  . Not on file  Social History Narrative  . Not on file     Family History: The patient's family history includes Colon cancer in his mother.  ROS:   Please see the history of present illness.    All other systems reviewed and are negative.  EKGs/Labs/Other Studies Reviewed:    The following studies were reviewed today: Atrial flutter with well-controlled ventricular rate.   Recent Labs: No results found for requested labs within last 8760 hours.  Recent Lipid Panel No results found for: CHOL, TRIG, HDL, CHOLHDL, VLDL, LDLCALC, LDLDIRECT  Physical Exam:    VS:  BP (!) 144/72 (BP Location: Right Arm, Patient Position: Sitting, Cuff Size: Normal)   Pulse (!) 54   Ht 5\' 10"  (1.778 m)   Wt 177 lb (80.3 kg)   SpO2 95%   BMI 25.40 kg/m     Wt Readings from Last 3 Encounters:  02/26/18 177 lb (80.3 kg)  12/01/15 153 lb (69.4 kg)  10/30/15 145 lb 1 oz (65.8 kg)     GEN: Patient is in no acute distress HEENT: Normal NECK: No JVD; No carotid bruits LYMPHATICS: No lymphadenopathy CARDIAC: Hear sounds regular, 2/6 systolic murmur at the apex. RESPIRATORY:  Clear to auscultation without rales, wheezing or rhonchi  ABDOMEN: Soft, non-tender, non-distended MUSCULOSKELETAL:  No edema; No deformity  SKIN: Warm and dry NEUROLOGIC:  Alert and oriented x 3 PSYCHIATRIC:  Normal affect   Signed, Garwin Brothersajan R Revankar, MD  02/26/2018 8:55 AM    [facility]

## 2018-02-26 NOTE — Patient Instructions (Signed)
Medication Instructions:  A family member needs to call the office this week with a complete med list.  Labwork: Your physician recommends that you have the following labs drawn: CBC, BMP, TSH, liver and lipid panel.  Testing/Procedures: None  Follow-Up: Your physician recommends that you schedule a follow-up appointment in: 6 months  Any Other Special Instructions Will Be Listed Below (If Applicable).     If you need a refill on your cardiac medications before your next appointment, please call your pharmacy.   [facility]  Garey HamAshley A, RN, BSN

## 2018-02-26 NOTE — Progress Notes (Signed)
Informed patient that he needs to take 325 mg of enteric coated aspirin daily per Dr. Tomie Chinaevankar. Also instructed patient to go home and review his current medications and call the office with a complete list.

## 2018-02-28 ENCOUNTER — Other Ambulatory Visit: Payer: Self-pay

## 2018-02-28 DIAGNOSIS — E875 Hyperkalemia: Secondary | ICD-10-CM

## 2018-02-28 MED ORDER — SODIUM POLYSTYRENE SULFONATE 15 GM/60ML PO SUSP: 15.0000 g | Freq: Once | ORAL | 0 refills | Status: AC

## 2018-02-28 NOTE — Telephone Encounter (Signed)
Clarified med list with patient face to face. Decreased aspirin to 81 mg daily per Revankar. Informed patient of his hyperkalemia and sent kayexalate to the pharmacy.

## 2018-04-23 DIAGNOSIS — N183 Chronic kidney disease, stage 3 unspecified: Secondary | ICD-10-CM

## 2018-04-23 HISTORY — DX: Chronic kidney disease, stage 3 unspecified: N18.30

## 2018-09-20 ENCOUNTER — Encounter: Payer: Self-pay | Admitting: Cardiology

## 2018-09-20 ENCOUNTER — Ambulatory Visit (INDEPENDENT_AMBULATORY_CARE_PROVIDER_SITE_OTHER): Payer: Medicare Other | Admitting: Cardiology

## 2018-09-20 VITALS — BP 160/68 | HR 87 | Ht 70.0 in | Wt 179.0 lb

## 2018-09-20 DIAGNOSIS — I2510 Atherosclerotic heart disease of native coronary artery without angina pectoris: Secondary | ICD-10-CM

## 2018-09-20 DIAGNOSIS — I10 Essential (primary) hypertension: Secondary | ICD-10-CM | POA: Diagnosis not present

## 2018-09-20 DIAGNOSIS — E782 Mixed hyperlipidemia: Secondary | ICD-10-CM

## 2018-09-20 MED ORDER — NITROGLYCERIN 0.4 MG SL SUBL: 0.4000 mg | SUBLINGUAL_TABLET | SUBLINGUAL | 3 refills | Status: AC | PRN

## 2018-09-20 NOTE — Progress Notes (Signed)
Cardiology Office Note:    Date:  09/20/2018   ID:  Jacob Benitez, DOB 12/15/1929, MRN [id number]  PCP:  Doreene Elandhomas, Millard B, MD  Cardiologist:  Garwin Brothersajan R Maurisio Ruddy, MD   Referring MD: Doreene Elandhomas, Millard B, MD    ASSESSMENT:    1. CAD in native artery   2. Essential (primary) hypertension   3. Mixed hyperlipidemia    PLAN:    In order of problems listed above:  1. Patient's coronary artery disease is clinically stable.  Secondary prevention stressed with the patient.  Importance of compliance with diet and medication stressed and he vocalized understanding.  He mentions to me that his blood pressure is stable at home I have asked him to send me a track of his blood pressures and I will intervene if necessary. 2. Sublingual nitroglycerin prescription was sent, its protocol and 911 protocol explained and the patient vocalized understanding questions were answered to the patient's satisfaction. 3. He will have fasting blood work today.  Including lipids 4. Patient will be seen in follow-up appointment in 6 months or earlier if the patient has any concerns    Medication Adjustments/Labs and Tests Ordered: Current medicines are reviewed at length with the patient today.  Concerns regarding medicines are outlined above.  No orders of the defined types were placed in this encounter.  No orders of the defined types were placed in this encounter.    No chief complaint on file.    History of Present Illness:    Jacob Benitez is a 85 y.o. male.  Patient has past medical history of coronary artery disease and denies any problems at this time and takes care of activities of daily living.  No chest pain orthopnea or PND.  He leads a sedentary lifestyle.  He has issues with dementia.  He mentions to me that he has an element of whitecoat hypertension.  Past Medical History:  Diagnosis Date  . Hypercholesteremia   . Hypertension     Past Surgical History:  Procedure Laterality  Date  . CORONARY STENT PLACEMENT    . PROCTECTOMY      Current Medications: Current Meds  Medication Sig  . aspirin EC 81 MG tablet Take 81 mg by mouth every 4 (four) hours as needed.   . clopidogrel (PLAVIX) 75 MG tablet Take 75 mg by mouth daily.  Marland Kitchen. donepezil (ARICEPT) 10 MG tablet Take 20 mg by mouth 2 (two) times daily.  Marland Kitchen. ibuprofen (ADVIL,MOTRIN) 200 MG tablet Take 200 mg by mouth every 6 (six) hours as needed for moderate pain.  Marland Kitchen. lisinopril (PRINIVIL,ZESTRIL) 20 MG tablet Take 20 mg by mouth daily.  . simvastatin (ZOCOR) 40 MG tablet Take 40 mg by mouth daily.     Allergies:   Patient has no known allergies.   Social History   Socioeconomic History  . Marital status: Single    Spouse name: Not on file  . Number of children: Not on file  . Years of education: Not on file  . Highest education level: Not on file  Occupational History  . Not on file  Social Needs  . Financial resource strain: Not on file  . Food insecurity:    Worry: Not on file    Inability: Not on file  . Transportation needs:    Medical: Not on file    Non-medical: Not on file  Tobacco Use  . Smoking status: Former Smoker    Packs/day: 1.00    Years: 20.00  Pack years: 20.00    Types: Cigarettes    Last attempt to quit: 10/29/1978    Years since quitting: 39.9  . Smokeless tobacco: Never Used  Substance and Sexual Activity  . Alcohol use: No    Alcohol/week: 0.0 standard drinks  . Drug use: No  . Sexual activity: Not on file  Lifestyle  . Physical activity:    Days per week: Not on file    Minutes per session: Not on file  . Stress: Not on file  Relationships  . Social connections:    Talks on phone: Not on file    Gets together: Not on file    Attends religious service: Not on file    Active member of club or organization: Not on file    Attends meetings of clubs or organizations: Not on file    Relationship status: Not on file  Other Topics Concern  . Not on file  Social  History Narrative  . Not on file     Family History: The patient's family history includes Colon cancer in his mother.  ROS:   Please see the history of present illness.    All other systems reviewed and are negative.  EKGs/Labs/Other Studies Reviewed:    The following studies were reviewed today: I discussed my findings with the patient at extensive length.   Recent Labs: 02/26/2018: ALT 18; BUN 16; Creatinine, Ser 1.23; Hemoglobin 14.1; Platelets 156; Potassium 5.6; Sodium 142; TSH 2.790  Recent Lipid Panel    Component Value Date/Time   CHOL 195 02/26/2018 0000   TRIG 163 (H) 02/26/2018 0000   HDL 55 02/26/2018 0000   CHOLHDL 3.5 02/26/2018 0000   LDLCALC 107 (H) 02/26/2018 0000    Physical Exam:    VS:  BP (!) 160/68 (BP Location: Right Arm, Patient Position: Sitting, Cuff Size: Normal)   Pulse 87   Ht 5\' 10"  (1.778 m)   Wt 179 lb (81.2 kg)   SpO2 93%   BMI 25.68 kg/m     Wt Readings from Last 3 Encounters:  09/20/18 179 lb (81.2 kg)  02/26/18 177 lb (80.3 kg)  12/01/15 153 lb (69.4 kg)     GEN: Patient is in no acute distress HEENT: Normal NECK: No JVD; No carotid bruits LYMPHATICS: No lymphadenopathy CARDIAC: Hear sounds regular, 2/6 systolic murmur at the apex. RESPIRATORY:  Clear to auscultation without rales, wheezing or rhonchi  ABDOMEN: Soft, non-tender, non-distended MUSCULOSKELETAL:  No edema; No deformity  SKIN: Warm and dry NEUROLOGIC:  Alert and oriented x 3 PSYCHIATRIC:  Normal affect   Signed, Garwin Brothers, MD  09/20/2018 8:25 AM    [facility]

## 2018-09-20 NOTE — Patient Instructions (Signed)
Medication Instructions:  Your physician has recommended you make the following change in your medication:   START: Nitroglycerin 0.4 mg tablet as needed.   If you need a refill on your cardiac medications before your next appointment, please call your pharmacy.   Lab work: Your physician recommends that you return for lab work in: BMP,CBC,TSH,LFT and Lipids  If you have labs (blood work) drawn today and your tests are completely normal, you will receive your results only by: Marland Kitchen MyChart Message (if you have MyChart) OR . A paper copy in the mail If you have any lab test that is abnormal or we need to change your treatment, we will call you to review the results.  Testing/Procedures: None  Follow-Up: At [facility], you and your health needs are our priority.  As part of our continuing mission to provide you with exceptional heart care, we have created designated Provider Care Teams.  These Care Teams include your primary Cardiologist (physician) and Advanced Practice Providers (APPs -  Physician Assistants and Nurse Practitioners) who all work together to provide you with the care you need, when you need it. You will need a follow up appointment in 6 months.  Please call our office 2 months in advance to schedule this appointment.  You may see No primary care provider on file. or another member of our [employer] Provider Team in [place]: Gypsy Balsam, MD . Norman Herrlich, MD  Any Other Special Instructions Will Be Listed Below (If Applicable).

## 2018-09-21 LAB — BASIC METABOLIC PANEL
BUN/Creatinine Ratio: 17 (ref 10–24)
BUN: 20 mg/dL (ref 8–27)
CALCIUM: 9.8 mg/dL (ref 8.6–10.2)
CHLORIDE: 100 mmol/L (ref 96–106)
CO2: 23 mmol/L (ref 20–29)
Creatinine, Ser: 1.16 mg/dL (ref 0.76–1.27)
GFR calc non Af Amer: 57 mL/min/{1.73_m2} — ABNORMAL LOW (ref >59)
GFR, EST AFRICAN AMERICAN: 66 mL/min/{1.73_m2} (ref >59)
GLUCOSE: 105 mg/dL — AB (ref 65–99)
POTASSIUM: 5 mmol/L (ref 3.5–5.2)
Sodium: 138 mmol/L (ref 134–144)

## 2018-09-21 LAB — LIPID PANEL
CHOLESTEROL TOTAL: 176 mg/dL (ref 100–199)
Chol/HDL Ratio: 3.4 ratio (ref 0.0–5.0)
HDL: 52 mg/dL (ref >39)
LDL Calculated: 93 mg/dL (ref 0–99)
TRIGLYCERIDES: 154 mg/dL — AB (ref 0–149)
VLDL Cholesterol Cal: 31 mg/dL (ref 5–40)

## 2018-09-21 LAB — CBC
HEMOGLOBIN: 14.3 g/dL (ref 13.0–17.7)
Hematocrit: 42.1 % (ref 37.5–51.0)
MCH: 31 pg (ref 26.6–33.0)
MCHC: 34 g/dL (ref 31.5–35.7)
MCV: 91 fL (ref 79–97)
Platelets: 161 10*3/uL (ref 150–450)
RBC: 4.61 x10E6/uL (ref 4.14–5.80)
RDW: 12.9 % (ref 11.6–15.4)
WBC: 6.6 10*3/uL (ref 3.4–10.8)

## 2018-09-21 LAB — TSH: TSH: 2.17 u[IU]/mL (ref 0.450–4.500)

## 2018-12-04 DIAGNOSIS — D692 Other nonthrombocytopenic purpura: Secondary | ICD-10-CM

## 2018-12-04 HISTORY — DX: Other nonthrombocytopenic purpura: D69.2

## 2019-05-14 ENCOUNTER — Other Ambulatory Visit: Payer: Self-pay

## 2019-05-14 ENCOUNTER — Encounter: Payer: Self-pay | Admitting: Cardiology

## 2019-05-14 ENCOUNTER — Ambulatory Visit (INDEPENDENT_AMBULATORY_CARE_PROVIDER_SITE_OTHER): Payer: Medicare Other | Admitting: Cardiology

## 2019-05-14 VITALS — BP 158/82 | HR 77 | Ht 70.0 in | Wt 170.0 lb

## 2019-05-14 DIAGNOSIS — I10 Essential (primary) hypertension: Secondary | ICD-10-CM

## 2019-05-14 DIAGNOSIS — Z955 Presence of coronary angioplasty implant and graft: Secondary | ICD-10-CM | POA: Diagnosis not present

## 2019-05-14 DIAGNOSIS — Z1329 Encounter for screening for other suspected endocrine disorder: Secondary | ICD-10-CM

## 2019-05-14 DIAGNOSIS — I2510 Atherosclerotic heart disease of native coronary artery without angina pectoris: Secondary | ICD-10-CM

## 2019-05-14 DIAGNOSIS — N1831 Chronic kidney disease, stage 3a: Secondary | ICD-10-CM

## 2019-05-14 DIAGNOSIS — E1121 Type 2 diabetes mellitus with diabetic nephropathy: Secondary | ICD-10-CM | POA: Diagnosis not present

## 2019-05-14 DIAGNOSIS — E782 Mixed hyperlipidemia: Secondary | ICD-10-CM

## 2019-05-14 NOTE — Patient Instructions (Signed)
Medication Instructions:  Your physician recommends that you continue on your current medications as directed. Please refer to the Current Medication list given to you today.  If you need a refill on your cardiac medications before your next appointment, please call your pharmacy.   Lab work: Your physician recommends that you have a lipid, hepatic, TSH, CBC, and BMP drawn. If you have labs (blood work) drawn today and your tests are completely normal, you will receive your results only by: Marland Kitchen MyChart Message (if you have MyChart) OR . A paper copy in the mail If you have any lab test that is abnormal or we need to change your treatment, we will call you to review the results.  Testing/Procedures: NONE  Follow-Up: At [facility], you and your health needs are our priority.  As part of our continuing mission to provide you with exceptional heart care, we have created designated Provider Care Teams.  These Care Teams include your primary Cardiologist (physician) and Advanced Practice Providers (APPs -  Physician Assistants and Nurse Practitioners) who all work together to provide you with the care you need, when you need it. You will need a follow up appointment in 6 months.

## 2019-05-14 NOTE — Progress Notes (Signed)
Cardiology Office Note:    Date:  05/14/2019   ID:  Jacob Benitez, DOB September 27, 1929, MRN [id number]  PCP:  Doreene Eland, MD  Cardiologist:  Garwin Brothers, MD   Referring MD: Doreene Eland, MD    ASSESSMENT:    1. Essential (primary) hypertension   2. CAD in native artery   3. Type 2 diabetes mellitus with stage 3a chronic kidney disease, without long-term current use of insulin (HCC)   4. H/O heart artery stent   5. Mixed hyperlipidemia    PLAN:    In order of problems listed above:  1. Coronary artery disease: Secondary prevention stressed with the patient.  Importance of compliance with diet and medication stressed and he vocalized understanding.  His blood pressure is stable. 2. Essential hypertension: Blood pressure stable he mentions to me that he has an element of whitecoat hypertension.  Especially for a gentleman this age and doing well I would not be very aggressive with lowering his blood pressure medications for adverse reactions and effects.   3. Mixed dyslipidemia: We will have blood work done today.  Diet        was discussed importance of regular ambulation. Patient will be seen in follow-up appointment in 6 months or earlier if the patient has any concerns   Medication Adjustments/Labs and Tests Ordered: Current medicines are reviewed at length with the patient today.  Concerns regarding medicines are outlined above.  No orders of the defined types were placed in this encounter.  No orders of the defined types were placed in this encounter.    No chief complaint on file.    History of Present Illness:    Jacob Benitez is a 85 y.o. male.  Patient has past medical history of coronary artery disease post stenting, essential hypertension and dyslipidemia.  Patient denies any problems at this time and ambulates age appropriately.  No chest pain orthopnea or PND.  At the time of my evaluation, the patient is alert awake oriented and in no  distress.  Past Medical History:  Diagnosis Date   Hypercholesteremia    Hypertension     Past Surgical History:  Procedure Laterality Date   CORONARY STENT PLACEMENT     PROCTECTOMY      Current Medications: Current Meds  Medication Sig   aspirin EC 81 MG tablet Take 81 mg by mouth every 4 (four) hours as needed.    clopidogrel (PLAVIX) 75 MG tablet Take 75 mg by mouth daily.   donepezil (ARICEPT) 10 MG tablet Take 20 mg by mouth 2 (two) times daily.   ibuprofen (ADVIL,MOTRIN) 200 MG tablet Take 200 mg by mouth every 6 (six) hours as needed for moderate pain.   lisinopril (PRINIVIL,ZESTRIL) 20 MG tablet Take 20 mg by mouth daily.   nitroGLYCERIN (NITROSTAT) 0.4 MG SL tablet Place 1 tablet (0.4 mg total) under the tongue every 5 (five) minutes as needed for chest pain.   simvastatin (ZOCOR) 40 MG tablet Take 40 mg by mouth daily.     Allergies:   Patient has no known allergies.   Social History   Socioeconomic History   Marital status: Single    Spouse name: Not on file   Number of children: Not on file   Years of education: Not on file   Highest education level: Not on file  Occupational History   Not on file  Social Needs   Financial resource strain: Not on file   Food insecurity  Worry: Not on file    Inability: Not on file   Transportation needs    Medical: Not on file    Non-medical: Not on file  Tobacco Use   Smoking status: Former Smoker    Packs/day: 1.00    Years: 20.00    Pack years: 20.00    Types: Cigarettes    Quit date: 10/29/1978    Years since quitting: 40.5   Smokeless tobacco: Never Used  Substance and Sexual Activity   Alcohol use: No    Alcohol/week: 0.0 standard drinks   Drug use: No   Sexual activity: Not on file  Lifestyle   Physical activity    Days per week: Not on file    Minutes per session: Not on file   Stress: Not on file  Relationships   Social connections    Talks on phone: Not on file     Gets together: Not on file    Attends religious service: Not on file    Active member of club or organization: Not on file    Attends meetings of clubs or organizations: Not on file    Relationship status: Not on file  Other Topics Concern   Not on file  Social History Narrative   Not on file     Family History: The patient's family history includes Colon cancer in his mother.  ROS:   Please see the history of present illness.    All other systems reviewed and are negative.  EKGs/Labs/Other Studies Reviewed:    The following studies were reviewed today: I reviewed records and labs done in April by primary care physician   Recent Labs: 09/20/2018: BUN 20; Creatinine, Ser 1.16; Hemoglobin 14.3; Platelets 161; Potassium 5.0; Sodium 138; TSH 2.170  Recent Lipid Panel    Component Value Date/Time   CHOL 176 09/20/2018 0841   TRIG 154 (H) 09/20/2018 0841   HDL 52 09/20/2018 0841   CHOLHDL 3.4 09/20/2018 0841   LDLCALC 93 09/20/2018 0841    Physical Exam:    VS:  BP (!) 158/82 (BP Location: Right Arm, Patient Position: Sitting, Cuff Size: Normal)    Pulse 77    Ht 5\' 10"  (1.778 m)    Wt 170 lb (77.1 kg)    SpO2 92%    BMI 24.39 kg/m     Wt Readings from Last 3 Encounters:  05/14/19 170 lb (77.1 kg)  09/20/18 179 lb (81.2 kg)  02/26/18 177 lb (80.3 kg)     GEN: Patient is in no acute distress HEENT: Normal NECK: No JVD; No carotid bruits LYMPHATICS: No lymphadenopathy CARDIAC: Hear sounds regular, 2/6 systolic murmur at the apex. RESPIRATORY:  Clear to auscultation without rales, wheezing or rhonchi  ABDOMEN: Soft, non-tender, non-distended MUSCULOSKELETAL:  No edema; No deformity  SKIN: Warm and dry NEUROLOGIC:  Alert and oriented x 3 PSYCHIATRIC:  Normal affect   Signed, Jenean Lindau, MD  05/14/2019 8:51 AM    [place]

## 2019-05-15 LAB — BASIC METABOLIC PANEL
BUN/Creatinine Ratio: 17 (ref 10–24)
BUN: 21 mg/dL (ref 8–27)
CO2: 24 mmol/L (ref 20–29)
Calcium: 10 mg/dL (ref 8.6–10.2)
Chloride: 104 mmol/L (ref 96–106)
Creatinine, Ser: 1.24 mg/dL (ref 0.76–1.27)
GFR calc Af Amer: 60 mL/min/{1.73_m2} (ref >59)
GFR calc non Af Amer: 52 mL/min/{1.73_m2} — ABNORMAL LOW (ref >59)
Glucose: 161 mg/dL — ABNORMAL HIGH (ref 65–99)
Potassium: 5.2 mmol/L (ref 3.5–5.2)
Sodium: 140 mmol/L (ref 134–144)

## 2019-05-15 LAB — HEPATIC FUNCTION PANEL
ALT: 16 IU/L (ref 0–44)
AST: 22 IU/L (ref 0–40)
Albumin: 4.4 g/dL (ref 3.6–4.6)
Alkaline Phosphatase: 65 IU/L (ref 39–117)
Bilirubin Total: 0.6 mg/dL (ref 0.0–1.2)
Bilirubin, Direct: 0.2 mg/dL (ref 0.00–0.40)
Total Protein: 6.7 g/dL (ref 6.0–8.5)

## 2019-05-15 LAB — LIPID PANEL
Chol/HDL Ratio: 2.9 ratio (ref 0.0–5.0)
Cholesterol, Total: 173 mg/dL (ref 100–199)
HDL: 59 mg/dL (ref >39)
LDL Chol Calc (NIH): 98 mg/dL (ref 0–99)
Triglycerides: 86 mg/dL (ref 0–149)
VLDL Cholesterol Cal: 16 mg/dL (ref 5–40)

## 2019-05-15 LAB — CBC
Hematocrit: 45.2 % (ref 37.5–51.0)
Hemoglobin: 15.3 g/dL (ref 13.0–17.7)
MCH: 33 pg (ref 26.6–33.0)
MCHC: 33.8 g/dL (ref 31.5–35.7)
MCV: 97 fL (ref 79–97)
Platelets: 154 10*3/uL (ref 150–450)
RBC: 4.64 x10E6/uL (ref 4.14–5.80)
RDW: 13.2 % (ref 11.6–15.4)
WBC: 5.3 10*3/uL (ref 3.4–10.8)

## 2019-05-15 LAB — TSH: TSH: 1.61 u[IU]/mL (ref 0.450–4.500)

## 2019-05-21 ENCOUNTER — Telehealth: Payer: Self-pay

## 2019-05-21 NOTE — Telephone Encounter (Signed)
Left message for patient to call back for results, copy sent to Dr. Marcello Moores.

## 2019-05-21 NOTE — Telephone Encounter (Signed)
-----   Message from Jenean Lindau, MD sent at 05/15/2019  9:31 AM EDT ----- The results of the study is unremarkable. Please inform patient. I will discuss in detail at next appointment. Cc  primary care/referring physician Jenean Lindau, MD 05/15/2019 9:31 AM

## 2019-06-12 ENCOUNTER — Ambulatory Visit: Payer: Medicare Other | Admitting: Cardiology

## 2020-06-03 ENCOUNTER — Emergency Department (HOSPITAL_COMMUNITY)
Admission: EM | Admit: 2020-06-03 | Discharge: 2020-06-03 | Disposition: A | Discharge status: Home / Self Care | Payer: Medicare Other | Attending: Emergency Medicine | Admitting: Emergency Medicine

## 2020-06-03 ENCOUNTER — Other Ambulatory Visit: Payer: Self-pay

## 2020-06-03 ENCOUNTER — Encounter (HOSPITAL_COMMUNITY): Payer: Self-pay

## 2020-06-03 ENCOUNTER — Emergency Department (HOSPITAL_COMMUNITY): Payer: Medicare Other

## 2020-06-03 DIAGNOSIS — Z23 Encounter for immunization: Secondary | ICD-10-CM | POA: Insufficient documentation

## 2020-06-03 DIAGNOSIS — Z79899 Other long term (current) drug therapy: Secondary | ICD-10-CM | POA: Insufficient documentation

## 2020-06-03 DIAGNOSIS — J439 Emphysema, unspecified: Secondary | ICD-10-CM | POA: Diagnosis not present

## 2020-06-03 DIAGNOSIS — Z87891 Personal history of nicotine dependence: Secondary | ICD-10-CM | POA: Insufficient documentation

## 2020-06-03 DIAGNOSIS — S3991XA Unspecified injury of abdomen, initial encounter: Secondary | ICD-10-CM | POA: Diagnosis present

## 2020-06-03 DIAGNOSIS — Z7982 Long term (current) use of aspirin: Secondary | ICD-10-CM | POA: Insufficient documentation

## 2020-06-03 DIAGNOSIS — R930 Abnormal findings on diagnostic imaging of skull and head, not elsewhere classified: Secondary | ICD-10-CM | POA: Diagnosis not present

## 2020-06-03 DIAGNOSIS — Z955 Presence of coronary angioplasty implant and graft: Secondary | ICD-10-CM | POA: Insufficient documentation

## 2020-06-03 DIAGNOSIS — W28XXXA Contact with powered lawn mower, initial encounter: Secondary | ICD-10-CM | POA: Insufficient documentation

## 2020-06-03 DIAGNOSIS — I129 Hypertensive chronic kidney disease with stage 1 through stage 4 chronic kidney disease, or unspecified chronic kidney disease: Secondary | ICD-10-CM | POA: Insufficient documentation

## 2020-06-03 DIAGNOSIS — N183 Chronic kidney disease, stage 3 unspecified: Secondary | ICD-10-CM | POA: Diagnosis not present

## 2020-06-03 DIAGNOSIS — E1122 Type 2 diabetes mellitus with diabetic chronic kidney disease: Secondary | ICD-10-CM | POA: Insufficient documentation

## 2020-06-03 DIAGNOSIS — S301XXA Contusion of abdominal wall, initial encounter: Secondary | ICD-10-CM | POA: Diagnosis not present

## 2020-06-03 DIAGNOSIS — Z7901 Long term (current) use of anticoagulants: Secondary | ICD-10-CM | POA: Diagnosis not present

## 2020-06-03 DIAGNOSIS — I2510 Atherosclerotic heart disease of native coronary artery without angina pectoris: Secondary | ICD-10-CM | POA: Diagnosis not present

## 2020-06-03 LAB — I-STAT CHEM 8, ED
BUN: 24 mg/dL — ABNORMAL HIGH (ref 8–23)
Calcium, Ion: 1.19 mmol/L (ref 1.15–1.40)
Chloride: 107 mmol/L (ref 98–111)
Creatinine, Ser: 1.2 mg/dL (ref 0.61–1.24)
Glucose, Bld: 114 mg/dL — ABNORMAL HIGH (ref 70–99)
HCT: 39 % (ref 39.0–52.0)
Hemoglobin: 13.3 g/dL (ref 13.0–17.0)
Potassium: 4.6 mmol/L (ref 3.5–5.1)
Sodium: 141 mmol/L (ref 135–145)
TCO2: 23 mmol/L (ref 22–32)

## 2020-06-03 LAB — CBC WITH DIFFERENTIAL/PLATELET
Abs Immature Granulocytes: 0.04 10*3/uL (ref 0.00–0.07)
Basophils Absolute: 0.1 10*3/uL (ref 0.0–0.1)
Basophils Relative: 1 %
Eosinophils Absolute: 0.5 10*3/uL (ref 0.0–0.5)
Eosinophils Relative: 6 %
HCT: 41.3 % (ref 39.0–52.0)
Hemoglobin: 13.3 g/dL (ref 13.0–17.0)
Immature Granulocytes: 0 %
Lymphocytes Relative: 21 %
Lymphs Abs: 2 10*3/uL (ref 0.7–4.0)
MCH: 32.8 pg (ref 26.0–34.0)
MCHC: 32.2 g/dL (ref 30.0–36.0)
MCV: 102 fL — ABNORMAL HIGH (ref 80.0–100.0)
Monocytes Absolute: 0.6 10*3/uL (ref 0.1–1.0)
Monocytes Relative: 7 %
Neutro Abs: 6.2 10*3/uL (ref 1.7–7.7)
Neutrophils Relative %: 65 %
Platelets: 187 10*3/uL (ref 150–400)
RBC: 4.05 MIL/uL — ABNORMAL LOW (ref 4.22–5.81)
RDW: 13.7 % (ref 11.5–15.5)
WBC: 9.5 10*3/uL (ref 4.0–10.5)
nRBC: 0 % (ref 0.0–0.2)

## 2020-06-03 LAB — BASIC METABOLIC PANEL
Anion gap: 10 (ref 5–15)
BUN: 22 mg/dL (ref 8–23)
CO2: 22 mmol/L (ref 22–32)
Calcium: 9.5 mg/dL (ref 8.9–10.3)
Chloride: 108 mmol/L (ref 98–111)
Creatinine, Ser: 1.21 mg/dL (ref 0.61–1.24)
GFR, Estimated: 58 mL/min — ABNORMAL LOW (ref >60)
Glucose, Bld: 116 mg/dL — ABNORMAL HIGH (ref 70–99)
Potassium: 4.7 mmol/L (ref 3.5–5.1)
Sodium: 140 mmol/L (ref 135–145)

## 2020-06-03 MED ORDER — IOHEXOL 300 MG/ML  SOLN
100.0000 mL | Freq: Once | INTRAMUSCULAR | Status: AC | PRN
  Administered 2020-06-03: 100 mL via INTRAVENOUS

## 2020-06-03 MED ORDER — TETANUS-DIPHTH-ACELL PERTUSSIS 5-2.5-18.5 LF-MCG/0.5 IM SUSY
0.5000 mL | PREFILLED_SYRINGE | Freq: Once | INTRAMUSCULAR | Status: AC
  Administered 2020-06-03: 0.5 mL via INTRAMUSCULAR
  Filled 2020-06-03: qty 0.5

## 2020-06-03 NOTE — ED Triage Notes (Signed)
Pt from home with [facility], pt was on lawn mower when it tipped over, pt thrown from mower but it did not land on him. Swelling to left flank, pt is on a blood thinner, skin tear to left forearm. No LOC. Denies neck or back pain. Pt ambulatory. Pt alert, disoriented, hx of dementia. VSS

## 2020-06-03 NOTE — ED Notes (Signed)
Son in [place] , [place]: [phone]

## 2020-06-03 NOTE — ED Notes (Signed)
Patient transported to CT 

## 2020-06-03 NOTE — ED Provider Notes (Signed)
  Physical Exam  BP (!) 136/97   Pulse (!) 50   Temp 98.1 F (36.7 C) (Oral)   Resp 16   SpO2 100%   Physical Exam  ED Course/Procedures     Procedures  MDM  Care assumed at 3 pm.  Patient of the lumbar and was noted to have bruising on the left flank area.  Patient also had head injury.  Signout pending trauma scan and labs.  4:39 PM Labs unremarkable.  CT showed subcutaneous hematoma in the left flank area with no active extravasation.  Patient has some pockets of gas likely from IV injection and patient does not have any signs of trauma on clinical exam of the right arm.  Patient is ambulatory and refused any pain meds.  Stable for discharge.       Charlynne Pander, MD 06/03/20 1640

## 2020-06-03 NOTE — ED Provider Notes (Signed)
[facility] EMERGENCY DEPARTMENT Provider Note   CSN: [id number] Arrival date & time: 06/03/20  1307     History Chief Complaint  Patient presents with  . Fall    Jacob Benitez is a 86 y.o. male.  86 yo M with a chief complaints of a lawnmower accident.  The patient was on a riding lawnmower and he was trying to mow an embankment when the lawnmower rolled over.  He states he was not seriously injured and try to get up and get back to mowing the lawn and bystanders stopped him and told me to stop and eventually EMS showed up and took him to the hospital.  He does not think that he seriously injured though is not sure exactly what happened.  He denies loss of consciousness denies chest pain denies abdominal pain denies back pain.  He states he has a wound to his left forearm that he thinks could just be wrapped up and he can go back to work.  The history is provided by the patient.  Fall This is a new problem. The current episode started 1 to 2 hours ago. The problem occurs rarely. The problem has been resolved. Pertinent negatives include no chest pain, no abdominal pain, no headaches and no shortness of breath. Nothing aggravates the symptoms. Nothing relieves the symptoms. He has tried nothing for the symptoms. The treatment provided no relief.       Past Medical History:  Diagnosis Date  . Hypercholesteremia   . Hypertension     Patient Active Problem List   Diagnosis Date Noted  . CKD (chronic kidney disease) stage 3, GFR 30-59 ml/min (HCC) 04/23/2018  . Atrial flutter (HCC) 02/26/2018  . Actinic keratoses 12/07/2017  . H/O heart artery stent 04/12/2017  . Benign prostatic hyperplasia without lower urinary tract symptoms 06/30/2016  . Generalized anxiety disorder 06/29/2016  . High risk medication use 06/29/2016  . Late onset Alzheimer's disease without behavioral disturbance (HCC) 06/29/2016  . Malaise and fatigue 06/29/2016  . Mixed  hyperlipidemia 06/29/2016  . Orthostatic hypotension 06/29/2016  . Severe hearing loss of both ears 06/29/2016  . Tremor 06/29/2016  . Type 2 diabetes mellitus with stage 3 chronic kidney disease, without long-term current use of insulin (HCC) 06/29/2016  . Vitamin D deficiency 06/29/2016  . Back pain 12/01/2015  . Vertebral osteomyelitis (HCC)   . Spinal epidural abscess 10/29/2015  . Osteomyelitis of lumbar spine (HCC) 10/29/2015  . CAD in native artery 02/18/2015  . Essential (primary) hypertension 02/18/2015    Past Surgical History:  Procedure Laterality Date  . CORONARY STENT PLACEMENT    . PROCTECTOMY         Family History  Problem Relation Age of Onset  . Colon cancer Mother     Social History   Tobacco Use  . Smoking status: Former Smoker    Packs/day: 1.00    Years: 20.00    Pack years: 20.00    Types: Cigarettes    Quit date: 10/29/1978    Years since quitting: 41.6  . Smokeless tobacco: Never Used  Vaping Use  . Vaping Use: Never used  Substance Use Topics  . Alcohol use: No    Alcohol/week: 0.0 standard drinks  . Drug use: No    Home Medications Prior to Admission medications   Medication Sig Start Date End Date Taking? Authorizing Provider  aspirin EC 81 MG tablet Take 81 mg by mouth daily.    Yes [provider]  brimonidine (ALPHAGAN P) 0.1 % SOLN Place 1 drop into both eyes 3 (three) times daily.   Yes [provider]  clopidogrel (PLAVIX) 75 MG tablet Take 75 mg by mouth daily. 09/13/16  Yes [provider]  donepezil (ARICEPT) 10 MG tablet Take 20 mg by mouth daily.  10/22/15  Yes [provider]  dorzolamide (TRUSOPT) 2 % ophthalmic solution Place 1 drop into both eyes in the morning and at bedtime.   Yes [provider]  ibuprofen (ADVIL,MOTRIN) 200 MG tablet Take 200 mg by mouth every 6 (six) hours as needed for moderate pain.   Yes [provider]  latanoprost (XALATAN) 0.005 % ophthalmic  solution Place 1 drop into both eyes at bedtime.   Yes [provider]  lisinopril (PRINIVIL,ZESTRIL) 20 MG tablet Take 20 mg by mouth daily. 10/22/15  Yes [provider]  Multiple Vitamin (MULTIVITAMIN) capsule Take 1 capsule by mouth daily.   Yes [provider]  Multiple Vitamins-Minerals (ICAPS AREDS FORMULA PO) Take 1 tablet by mouth in the morning and at bedtime.   Yes [provider]  nitroGLYCERIN (NITROSTAT) 0.4 MG SL tablet Place 1 tablet (0.4 mg total) under the tongue every 5 (five) minutes as needed for chest pain. 09/20/18 05/13/24 Yes Revankar, Aundra Dubin, MD  simvastatin (ZOCOR) 40 MG tablet Take 40 mg by mouth daily. 12/04/17  Yes [provider]    Allergies    Patient has no known allergies.  Review of Systems   Review of Systems  Constitutional: Negative for chills and fever.  HENT: Negative for congestion and facial swelling.   Eyes: Negative for discharge and visual disturbance.  Respiratory: Negative for shortness of breath.   Cardiovascular: Negative for chest pain and palpitations.  Gastrointestinal: Negative for abdominal pain, diarrhea and vomiting.  Musculoskeletal: Negative for arthralgias and myalgias.  Skin: Positive for wound. Negative for color change and rash.  Neurological: Negative for tremors, syncope and headaches.  Psychiatric/Behavioral: Negative for confusion and dysphoric mood.    Physical Exam Updated Vital Signs BP (!) 136/97   Pulse (!) 50   Temp 98.1 F (36.7 C) (Oral)   Resp 16   SpO2 100%   Physical Exam Vitals and nursing note reviewed.  Constitutional:      Appearance: He is well-developed.  HENT:     Head: Normocephalic and atraumatic.  Eyes:     Pupils: Pupils are equal, round, and reactive to light.  Neck:     Vascular: No JVD.  Cardiovascular:     Rate and Rhythm: Normal rate and regular rhythm.     Heart sounds: No murmur heard.  No friction rub. No gallop.   Pulmonary:      Effort: No respiratory distress.     Breath sounds: No wheezing.  Abdominal:     General: There is no distension.     Tenderness: There is no abdominal tenderness. There is no guarding or rebound.     Comments: Large left flank hematoma.  No abdominal pain with deep palpation in all quadrants including the left lower quadrant.  Musculoskeletal:        General: Tenderness present. Normal range of motion.     Cervical back: Normal range of motion and neck supple.     Comments: Palpated from head to toe without any noted areas of bony tenderness.  He does have a skin tear to the left volar forearm.  Approximately silver dollar size.  Skin:  Coloration: Skin is not pale.     Findings: No rash.  Neurological:     Mental Status: He is alert and oriented to person, place, and time.  Psychiatric:        Behavior: Behavior normal.     ED Results / Procedures / Treatments   Labs (all labs ordered are listed, but only abnormal results are displayed) Labs Reviewed  CBC WITH DIFFERENTIAL/PLATELET  BASIC METABOLIC PANEL  I-STAT CHEM 8, ED    EKG EKG Interpretation  Date/Time:  Wednesday June 03 2020 13:36:13 EDT Ventricular Rate:  73 PR Interval:    QRS Duration: 115 QT Interval:  454 QTC Calculation: 501 R Axis:   -83 Text Interpretation: Normal sinus rhythm Incomplete RBBB and LAFB Anteroseptal infarct, age indeterminate No significant change since last tracing Confirmed by Melene Plan [phone]) on 06/03/2020 1:41:39 PM   Radiology No results found.  Procedures Procedures (including critical care time)  Medications Ordered in ED Medications  Tdap (BOOSTRIX) injection 0.5 mL (0.5 mLs Intramuscular Given 06/03/20 1511)    ED Course  I have reviewed the triage vital signs and the nursing notes.  Pertinent labs & imaging results that were available during my care of the patient were reviewed by me and considered in my medical decision making (see chart for details).      MDM Rules/Calculators/A&P                          86 yo M with a chief complaints of falling off of a riding lawnmower.  The patient had apparently gone up an embankment and lost his balance and fell off and then the lawnmower rolled.  He denies any significant injury and was told that he was forced to come here.  He does have a very large left flank hematoma.  He has no abdominal tenderness with this.  He has a skin tear to his left forearm.  Unsure of tetanus status will update.  Wound care at bedside.  CT scan of the head and abdomen pelvis.  Signed out to Dr. Silverio Lay, please see his note for further details of care in the ED.   The patients results and plan were reviewed and discussed.   Any x-rays performed were independently reviewed by myself.   Differential diagnosis were considered with the presenting HPI.  Medications  Tdap (BOOSTRIX) injection 0.5 mL (0.5 mLs Intramuscular Given 06/03/20 1511)    Vitals:   06/03/20 1345 06/03/20 1400 06/03/20 1415 06/03/20 1430  BP: 132/81 (!) 131/108 131/60 (!) 136/97  Pulse: 69 68 73 (!) 50  Resp: 16 15 15 16   Temp:      TempSrc:      SpO2: 98% 100% 100% 100%    Final diagnoses:  Accident caused by powered lawn mower, initial encounter     Final Clinical Impression(s) / ED Diagnoses Final diagnoses:  Accident caused by powered lawn mower, initial encounter    Rx / DC Orders ED Discharge Orders    None       , DO 06/03/20 1520

## 2020-06-03 NOTE — Discharge Instructions (Signed)
You have a hematoma in the left flank area.  Expect to become more black and blue.  Take Tylenol for pain.  See your doctor for follow  Return to ER if you have worse flank pain, abdominal pain, vomiting, fevers.

## 2020-06-18 ENCOUNTER — Telehealth: Payer: Self-pay | Admitting: Cardiology

## 2020-06-18 NOTE — Telephone Encounter (Signed)
New message:    Patient was seen today with his PCP and would like the patient be seen soon patient has been passing out. Please call the nurse the doctor do not have anything.

## 2020-06-18 NOTE — Telephone Encounter (Signed)
Called the doctor's office and appointment made for 06/19/20 in [place].

## 2020-06-19 ENCOUNTER — Ambulatory Visit: Payer: Medicare Other | Admitting: Cardiology

## 2020-06-19 DIAGNOSIS — E7800 Pure hypercholesterolemia, unspecified: Secondary | ICD-10-CM | POA: Insufficient documentation

## 2020-06-19 DIAGNOSIS — I10 Essential (primary) hypertension: Secondary | ICD-10-CM | POA: Insufficient documentation

## 2020-06-23 ENCOUNTER — Encounter: Payer: Self-pay | Admitting: Cardiology

## 2020-06-23 ENCOUNTER — Ambulatory Visit: Payer: Medicare Other | Admitting: Cardiology

## 2020-06-23 ENCOUNTER — Ambulatory Visit: Payer: Medicare Other

## 2020-06-23 ENCOUNTER — Other Ambulatory Visit: Payer: Self-pay

## 2020-06-23 VITALS — Ht 66.0 in | Wt 169.4 lb

## 2020-06-23 DIAGNOSIS — I2510 Atherosclerotic heart disease of native coronary artery without angina pectoris: Secondary | ICD-10-CM

## 2020-06-23 DIAGNOSIS — E782 Mixed hyperlipidemia: Secondary | ICD-10-CM

## 2020-06-23 DIAGNOSIS — I10 Essential (primary) hypertension: Secondary | ICD-10-CM

## 2020-06-23 DIAGNOSIS — G301 Alzheimer's disease with late onset: Secondary | ICD-10-CM

## 2020-06-23 DIAGNOSIS — F0280 Dementia in other diseases classified elsewhere without behavioral disturbance: Secondary | ICD-10-CM

## 2020-06-23 DIAGNOSIS — R55 Syncope and collapse: Secondary | ICD-10-CM

## 2020-06-23 MED ORDER — LISINOPRIL 10 MG PO TABS: 10.0000 mg | ORAL_TABLET | Freq: Every day | ORAL | 3 refills | Status: AC

## 2020-06-23 NOTE — Patient Instructions (Signed)
Medication Instructions:  Your physician has recommended you make the following change in your medication:   Decrease Lisinopril to 10 mg daily.  *If you need a refill on your cardiac medications before your next appointment, please call your pharmacy*   Lab Work: None ordered If you have labs (blood work) drawn today and your tests are completely normal, you will receive your results only by: Marland Kitchen MyChart Message (if you have MyChart) OR . A paper copy in the mail If you have any lab test that is abnormal or we need to change your treatment, we will call you to review the results.   Testing/Procedures: Your physician has requested that you have an echocardiogram. Echocardiography is a painless test that uses sound waves to create images of your heart. It provides your doctor with information about the size and shape of your heart and how well your heart's chambers and valves are working. This procedure takes approximately one hour. There are no restrictions for this procedure.   WHY IS MY DOCTOR PRESCRIBING ZIO? The Zio system is proven and trusted by physicians to detect and diagnose irregular heart rhythms -- and has been prescribed to hundreds of thousands of patients.  The FDA has cleared the Zio system to monitor for many different kinds of irregular heart rhythms. In a study, physicians were able to reach a diagnosis 90% of the time with the Zio system1.  You can wear the Zio monitor -- a small, discreet, comfortable patch -- during your normal day-to-day activity, including while you sleep, shower, and exercise, while it records every single heartbeat for analysis.  1Barrett, P., et al. Comparison of 24 Hour Holter Monitoring Versus 14 Day Novel Adhesive Patch Electrocardiographic Monitoring. American Journal of Medicine, 2014.  ZIO VS. HOLTER MONITORING The Zio monitor can be comfortably worn for up to 14 days. Holter monitors can be worn for 24 to 48 hours, limiting the time to  record any irregular heart rhythms you may have. Zio is able to capture data for the 51% of patients who have their first symptom-triggered arrhythmia after 48 hours.1  LIVE WITHOUT RESTRICTIONS The Zio ambulatory cardiac monitor is a small, unobtrusive, and water-resistant patch--you might even forget you're wearing it. The Zio monitor records and stores every beat of your heart, whether you're sleeping, working out, or showering.  Wear the monitor for 2 weeks, remove 07/07/20.   Follow-Up: At [facility], you and your health needs are our priority.  As part of our continuing mission to provide you with exceptional heart care, we have created designated Provider Care Teams.  These Care Teams include your primary Cardiologist (physician) and Advanced Practice Providers (APPs -  Physician Assistants and Nurse Practitioners) who all work together to provide you with the care you need, when you need it.  We recommend signing up for the patient portal called "MyChart".  Sign up information is provided on this After Visit Summary.  MyChart is used to connect with patients for Virtual Visits (Telemedicine).  Patients are able to view lab/test results, encounter notes, upcoming appointments, etc.  Non-urgent messages can be sent to your provider as well.   To learn more about what you can do with MyChart, go to [email or web address].    Your next appointment:   2 month(s)  The format for your next appointment:   In Person  Provider:   Belva Crome, MD   Other Instructions  Echocardiogram An echocardiogram is a procedure that uses painless sound waves (ultrasound) to  produce an image of the heart. Images from an echocardiogram can provide important information about:  Signs of coronary artery disease (CAD).  Aneurysm detection. An aneurysm is a weak or damaged part of an artery wall that bulges out from the normal force of blood pumping through the body.  Heart size and shape. Changes  in the size or shape of the heart can be associated with certain conditions, including heart failure, aneurysm, and CAD.  Heart muscle function.  Heart valve function.  Signs of a past heart attack.  Fluid buildup around the heart.  Thickening of the heart muscle.  A tumor or infectious growth around the heart valves. Tell a health care provider about:  Any allergies you have.  All medicines you are taking, including vitamins, herbs, eye drops, creams, and over-the-counter medicines.  Any blood disorders you have.  Any surgeries you have had.  Any medical conditions you have.  Whether you are pregnant or may be pregnant. What are the risks? Generally, this is a safe procedure. However, problems may occur, including:  Allergic reaction to dye (contrast) that may be used during the procedure. What happens before the procedure? No specific preparation is needed. You may eat and drink normally. What happens during the procedure?   An IV tube may be inserted into one of your veins.  You may receive contrast through this tube. A contrast is an injection that improves the quality of the pictures from your heart.  A gel will be applied to your chest.  A wand-like tool (transducer) will be moved over your chest. The gel will help to transmit the sound waves from the transducer.  The sound waves will harmlessly bounce off of your heart to allow the heart images to be captured in real-time motion. The images will be recorded on a computer. The procedure may vary among health care providers and hospitals. What happens after the procedure?  You may return to your normal, everyday life, including diet, activities, and medicines, unless your health care provider tells you not to do that. Summary  An echocardiogram is a procedure that uses painless sound waves (ultrasound) to produce an image of the heart.  Images from an echocardiogram can provide important information about the  size and shape of your heart, heart muscle function, heart valve function, and fluid buildup around your heart.  You do not need to do anything to prepare before this procedure. You may eat and drink normally.  After the echocardiogram is completed, you may return to your normal, everyday life, unless your health care provider tells you not to do that. This information is not intended to replace advice given to you by your health care provider. Make sure you discuss any questions you have with your health care provider. Document Revised: 11/15/2018 Document Reviewed: 08/27/2016 Elsevier Patient Education  2020 [employer].

## 2020-06-23 NOTE — Progress Notes (Signed)
Cardiology Office Note:    Date:  06/23/2020   ID:  Jacob Benitez, DOB 06/04/1930, MRN [id number]  PCP:  Jacob Benitez., MD  Cardiologist:  Jacob Brothers, MD   Referring MD: Jacob Eland, MD    ASSESSMENT:    1. CAD in native artery   2. Essential (primary) hypertension   3. Mixed hyperlipidemia   4. Late onset Alzheimer's disease without behavioral disturbance (HCC)   5. Syncope, unspecified syncope type    PLAN:    In order of problems listed above:  1. Coronary artery disease: Secondary prevention stressed with the patient.  Importance of compliance with diet medication stressed any vocalized understanding.  In view of his multiple comorbidities I do not think he needs evaluation for coronary artery disease or other obstructive coronary artery disease.  He is well ambulating around his home without any symptoms. 2. Syncope: Is of concern.  I will evaluate him with a 2-week monitor.  Also I think his blood pressure might be borderline to have cut on his lisinopril to 10 mg daily.  His son will keep a track of his blood pressures. 3. Cardiac murmur: Echocardiogram will be done to assess murmur heard on auscultation. 4. Mixed dyslipidemia: Diet was emphasized and he is doing well to follow this. 5. Patient will be seen in follow-up appointment in 6 months or earlier if the patient has any concerns    Medication Adjustments/Labs and Tests Ordered: Current medicines are reviewed at length with the patient today.  Concerns regarding medicines are outlined above.  No orders of the defined types were placed in this encounter.  No orders of the defined types were placed in this encounter.    No chief complaint on file.    History of Present Illness:    Jacob Benitez is a 86 y.o. male.  Patient has past medical history of coronary artery disease, essential hypertension dyslipidemia and diabetes mellitus.  He has history of Alzheimer's.  His son brings him  for this visit.  The son mentions to me that 1 day while having his biscuit he kept his hand on his chest and he passed out for a few seconds subsequently woke up and he was fine.  He also lives by himself.  His son lives close by.  The patient is active around his yard without any symptoms.  His activity does not give him any chest pain or chest tightness.  At the time of my evaluation, the patient is alert awake oriented and in no distress.  Past Medical History:  Diagnosis Date  . Actinic keratoses 12/07/2017  . Atrial flutter (HCC) 02/26/2018  . Back pain 12/01/2015  . Benign prostatic hyperplasia without lower urinary tract symptoms 06/30/2016   Surgery by [facility] in 2016.  Marland Kitchen CAD in native artery 02/18/2015  . CKD (chronic kidney disease) stage 3, GFR 30-59 ml/min (HCC) 04/23/2018  . Essential (primary) hypertension 02/18/2015  . Generalized anxiety disorder 06/29/2016  . H/O heart artery stent 04/12/2017  . High risk medication use 06/29/2016  . Hypercholesteremia   . Hypertension   . Late onset Alzheimer's disease without behavioral disturbance (HCC) 06/29/2016  . Malaise and fatigue 06/29/2016  . Mixed hyperlipidemia 06/29/2016  . Orthostatic hypotension 06/29/2016  . Osteomyelitis of lumbar spine (HCC) 10/29/2015  . Senile purpura (HCC) 12/04/2018  . Severe hearing loss of both ears 06/29/2016  . Spinal epidural abscess 10/29/2015  . Syncope 06/29/2016  . Tremor 06/29/2016  . Type  2 diabetes mellitus with stage 3 chronic kidney disease, without long-term current use of insulin (HCC) 06/29/2016  . Vertebral osteomyelitis (HCC)   . Vitamin D deficiency 06/29/2016    Past Surgical History:  Procedure Laterality Date  . CORONARY STENT PLACEMENT    . PROCTECTOMY      Current Medications: Current Meds  Medication Sig  . aspirin EC 81 MG tablet Take 81 mg by mouth daily.   . brimonidine (ALPHAGAN P) 0.1 % SOLN Place 1 drop into both eyes 3 (three) times daily.  . clopidogrel  (PLAVIX) 75 MG tablet Take 75 mg by mouth daily.  Marland Kitchen donepezil (ARICEPT) 10 MG tablet Take 20 mg by mouth daily.   . dorzolamide (TRUSOPT) 2 % ophthalmic solution Place 1 drop into both eyes in the morning and at bedtime.  Marland Kitchen ibuprofen (ADVIL,MOTRIN) 200 MG tablet Take 200 mg by mouth every 6 (six) hours as needed for moderate pain.  Marland Kitchen latanoprost (XALATAN) 0.005 % ophthalmic solution Place 1 drop into both eyes at bedtime.  Marland Kitchen lisinopril (PRINIVIL,ZESTRIL) 20 MG tablet Take 20 mg by mouth daily.  . Multiple Vitamin (MULTIVITAMIN) capsule Take 1 capsule by mouth daily.  . Multiple Vitamins-Minerals (ICAPS AREDS FORMULA PO) Take 1 tablet by mouth in the morning and at bedtime.  . nitroGLYCERIN (NITROSTAT) 0.4 MG SL tablet Place 1 tablet (0.4 mg total) under the tongue every 5 (five) minutes as needed for chest pain.  . simvastatin (ZOCOR) 40 MG tablet Take 40 mg by mouth daily.     Allergies:   Patient has no known allergies.   Social History   Socioeconomic History  . Marital status: Single    Spouse name: Not on file  . Number of children: Not on file  . Years of education: Not on file  . Highest education level: Not on file  Occupational History  . Not on file  Tobacco Use  . Smoking status: Former Smoker    Packs/day: 1.00    Years: 20.00    Pack years: 20.00    Types: Cigarettes    Quit date: 10/29/1978    Years since quitting: 41.6  . Smokeless tobacco: Never Used  Vaping Use  . Vaping Use: Never used  Substance and Sexual Activity  . Alcohol use: No    Alcohol/week: 0.0 standard drinks  . Drug use: No  . Sexual activity: Not on file  Other Topics Concern  . Not on file  Social History Narrative  . Not on file   Social Determinants of Health   Financial Resource Strain:   . Difficulty of Paying Living Expenses: Not on file  Food Insecurity:   . Worried About Programme researcher, broadcasting/film/video in the Last Year: Not on file  . Ran Out of Food in the Last Year: Not on file    Transportation Needs:   . Lack of Transportation (Medical): Not on file  . Lack of Transportation (Non-Medical): Not on file  Physical Activity:   . Days of Exercise per Week: Not on file  . Minutes of Exercise per Session: Not on file  Stress:   . Feeling of Stress : Not on file  Social Connections:   . Frequency of Communication with Friends and Family: Not on file  . Frequency of Social Gatherings with Friends and Family: Not on file  . Attends Religious Services: Not on file  . Active Member of Clubs or Organizations: Not on file  . Attends Banker Meetings:  Not on file  . Marital Status: Not on file     Family History: The patient's family history includes Colon cancer in his mother.  ROS:   Please see the history of present illness.    All other systems reviewed and are negative.  EKGs/Labs/Other Studies Reviewed:    The following studies were reviewed today: EKG reveals sinus rhythm and nonspecific ST-T changes   Recent Labs: 06/03/2020: BUN 24; Creatinine, Ser 1.20; Hemoglobin 13.3; Platelets 187; Potassium 4.6; Sodium 141  Recent Lipid Panel    Component Value Date/Time   CHOL 173 05/14/2019 0907   TRIG 86 05/14/2019 0907   HDL 59 05/14/2019 0907   CHOLHDL 2.9 05/14/2019 0907   LDLCALC 98 05/14/2019 0907    Physical Exam:    VS:  Ht 5\' 6"  (1.676 m)   Wt 169 lb 6.4 oz (76.8 kg)   SpO2 98%   BMI 27.34 kg/m     Wt Readings from Last 3 Encounters:  06/23/20 169 lb 6.4 oz (76.8 kg)  05/14/19 170 lb (77.1 kg)  09/20/18 179 lb (81.2 kg)     GEN: Patient is in no acute distress HEENT: Normal NECK: No JVD; No carotid bruits LYMPHATICS: No lymphadenopathy CARDIAC: Hear sounds regular, 2/6 systolic murmur at the apex. RESPIRATORY:  Clear to auscultation without rales, wheezing or rhonchi  ABDOMEN: Soft, non-tender, non-distended MUSCULOSKELETAL:  No edema; No deformity  SKIN: Warm and dry NEUROLOGIC:  Alert and oriented x 3 PSYCHIATRIC:   Normal affect   Signed, 09/22/18, MD  06/23/2020 2:45 PM    [facility]

## 2020-07-21 ENCOUNTER — Telehealth: Payer: Self-pay | Admitting: Physician Assistant

## 2020-07-22 ENCOUNTER — Encounter (HOSPITAL_COMMUNITY): Payer: Self-pay

## 2020-07-22 ENCOUNTER — Other Ambulatory Visit: Payer: Medicare Other

## 2020-07-22 ENCOUNTER — Emergency Department (HOSPITAL_COMMUNITY): Payer: Medicare Other

## 2020-07-22 ENCOUNTER — Inpatient Hospital Stay (HOSPITAL_COMMUNITY)
Admission: EM | Admit: 2020-07-22 | Discharge: 2020-07-24 | DRG: 244 | Disposition: A | Discharge status: Home / Self Care | Payer: Medicare Other | Attending: Internal Medicine | Admitting: Internal Medicine

## 2020-07-22 ENCOUNTER — Inpatient Hospital Stay (HOSPITAL_COMMUNITY): Payer: Medicare Other

## 2020-07-22 ENCOUNTER — Other Ambulatory Visit: Payer: Self-pay

## 2020-07-22 DIAGNOSIS — Z951 Presence of aortocoronary bypass graft: Secondary | ICD-10-CM

## 2020-07-22 DIAGNOSIS — Z7902 Long term (current) use of antithrombotics/antiplatelets: Secondary | ICD-10-CM

## 2020-07-22 DIAGNOSIS — F0280 Dementia in other diseases classified elsewhere without behavioral disturbance: Secondary | ICD-10-CM | POA: Diagnosis present

## 2020-07-22 DIAGNOSIS — I442 Atrioventricular block, complete: Secondary | ICD-10-CM

## 2020-07-22 DIAGNOSIS — Y92009 Unspecified place in unspecified non-institutional (private) residence as the place of occurrence of the external cause: Secondary | ICD-10-CM

## 2020-07-22 DIAGNOSIS — Z955 Presence of coronary angioplasty implant and graft: Secondary | ICD-10-CM | POA: Diagnosis not present

## 2020-07-22 DIAGNOSIS — G301 Alzheimer's disease with late onset: Secondary | ICD-10-CM | POA: Diagnosis present

## 2020-07-22 DIAGNOSIS — I129 Hypertensive chronic kidney disease with stage 1 through stage 4 chronic kidney disease, or unspecified chronic kidney disease: Secondary | ICD-10-CM | POA: Diagnosis present

## 2020-07-22 DIAGNOSIS — R55 Syncope and collapse: Secondary | ICD-10-CM | POA: Diagnosis present

## 2020-07-22 DIAGNOSIS — I483 Typical atrial flutter: Secondary | ICD-10-CM | POA: Diagnosis present

## 2020-07-22 DIAGNOSIS — W19XXXA Unspecified fall, initial encounter: Secondary | ICD-10-CM | POA: Diagnosis present

## 2020-07-22 DIAGNOSIS — N1832 Chronic kidney disease, stage 3b: Secondary | ICD-10-CM | POA: Diagnosis present

## 2020-07-22 DIAGNOSIS — Z7982 Long term (current) use of aspirin: Secondary | ICD-10-CM

## 2020-07-22 DIAGNOSIS — R001 Bradycardia, unspecified: Secondary | ICD-10-CM | POA: Diagnosis present

## 2020-07-22 DIAGNOSIS — R296 Repeated falls: Secondary | ICD-10-CM | POA: Diagnosis present

## 2020-07-22 DIAGNOSIS — E1122 Type 2 diabetes mellitus with diabetic chronic kidney disease: Secondary | ICD-10-CM | POA: Diagnosis present

## 2020-07-22 DIAGNOSIS — N400 Enlarged prostate without lower urinary tract symptoms: Secondary | ICD-10-CM | POA: Diagnosis present

## 2020-07-22 DIAGNOSIS — Z87891 Personal history of nicotine dependence: Secondary | ICD-10-CM

## 2020-07-22 DIAGNOSIS — H9193 Unspecified hearing loss, bilateral: Secondary | ICD-10-CM | POA: Diagnosis present

## 2020-07-22 DIAGNOSIS — E782 Mixed hyperlipidemia: Secondary | ICD-10-CM | POA: Diagnosis present

## 2020-07-22 DIAGNOSIS — I4811 Longstanding persistent atrial fibrillation: Secondary | ICD-10-CM

## 2020-07-22 DIAGNOSIS — Z20822 Contact with and (suspected) exposure to covid-19: Secondary | ICD-10-CM | POA: Diagnosis present

## 2020-07-22 DIAGNOSIS — I4891 Unspecified atrial fibrillation: Secondary | ICD-10-CM | POA: Diagnosis present

## 2020-07-22 DIAGNOSIS — Z8661 Personal history of infections of the central nervous system: Secondary | ICD-10-CM

## 2020-07-22 DIAGNOSIS — F411 Generalized anxiety disorder: Secondary | ICD-10-CM | POA: Diagnosis present

## 2020-07-22 DIAGNOSIS — Z79899 Other long term (current) drug therapy: Secondary | ICD-10-CM

## 2020-07-22 DIAGNOSIS — Z950 Presence of cardiac pacemaker: Secondary | ICD-10-CM

## 2020-07-22 DIAGNOSIS — I2510 Atherosclerotic heart disease of native coronary artery without angina pectoris: Secondary | ICD-10-CM | POA: Diagnosis present

## 2020-07-22 DIAGNOSIS — I4892 Unspecified atrial flutter: Secondary | ICD-10-CM | POA: Diagnosis not present

## 2020-07-22 HISTORY — DX: Atrioventricular block, complete: I44.2

## 2020-07-22 LAB — BASIC METABOLIC PANEL
Anion gap: 8 (ref 5–15)
BUN: 21 mg/dL (ref 8–23)
CO2: 25 mmol/L (ref 22–32)
Calcium: 9.7 mg/dL (ref 8.9–10.3)
Chloride: 104 mmol/L (ref 98–111)
Creatinine, Ser: 1.24 mg/dL (ref 0.61–1.24)
GFR, Estimated: 56 mL/min — ABNORMAL LOW (ref >60)
Glucose, Bld: 112 mg/dL — ABNORMAL HIGH (ref 70–99)
Potassium: 4.9 mmol/L (ref 3.5–5.1)
Sodium: 137 mmol/L (ref 135–145)

## 2020-07-22 LAB — RESP PANEL BY RT-PCR (FLU A&B, COVID) ARPGX2
Influenza A by PCR: NEGATIVE
Influenza B by PCR: NEGATIVE
SARS Coronavirus 2 by RT PCR: NEGATIVE

## 2020-07-22 LAB — CBC
HCT: 43.8 % (ref 39.0–52.0)
Hemoglobin: 14.3 g/dL (ref 13.0–17.0)
MCH: 33 pg (ref 26.0–34.0)
MCHC: 32.6 g/dL (ref 30.0–36.0)
MCV: 101.2 fL — ABNORMAL HIGH (ref 80.0–100.0)
Platelets: 155 10*3/uL (ref 150–400)
RBC: 4.33 MIL/uL (ref 4.22–5.81)
RDW: 13.2 % (ref 11.5–15.5)
WBC: 5.8 10*3/uL (ref 4.0–10.5)
nRBC: 0 % (ref 0.0–0.2)

## 2020-07-22 LAB — MRSA PCR SCREENING: MRSA by PCR: POSITIVE — AB

## 2020-07-22 MED ORDER — BRIMONIDINE TARTRATE 0.15 % OP SOLN
1.0000 [drp] | Freq: Three times a day (TID) | OPHTHALMIC | Status: DC
  Administered 2020-07-22 – 2020-07-24 (×5): 1 [drp] via OPHTHALMIC
  Filled 2020-07-22: qty 5

## 2020-07-22 MED ORDER — SIMVASTATIN 20 MG PO TABS
40.0000 mg | ORAL_TABLET | Freq: Every day | ORAL | Status: DC
  Administered 2020-07-23 – 2020-07-24 (×2): 40 mg via ORAL
  Filled 2020-07-22 (×2): qty 2

## 2020-07-22 MED ORDER — DORZOLAMIDE HCL 2 % OP SOLN
1.0000 [drp] | Freq: Two times a day (BID) | OPHTHALMIC | Status: DC
  Administered 2020-07-22 – 2020-07-24 (×4): 1 [drp] via OPHTHALMIC
  Filled 2020-07-22: qty 10

## 2020-07-22 MED ORDER — NITROGLYCERIN 0.4 MG SL SUBL: 0.4000 mg | SUBLINGUAL_TABLET | SUBLINGUAL | Status: DC | PRN

## 2020-07-22 MED ORDER — SODIUM CHLORIDE 0.9% FLUSH
3.0000 mL | Freq: Two times a day (BID) | INTRAVENOUS | Status: DC
  Administered 2020-07-22 – 2020-07-24 (×3): 3 mL via INTRAVENOUS

## 2020-07-22 MED ORDER — ASPIRIN EC 81 MG PO TBEC
81.0000 mg | DELAYED_RELEASE_TABLET | Freq: Every day | ORAL | Status: DC
  Administered 2020-07-24: 10:00:00 81 mg via ORAL
  Filled 2020-07-22: qty 1

## 2020-07-22 MED ORDER — LATANOPROST 0.005 % OP SOLN
1.0000 [drp] | Freq: Every day | OPHTHALMIC | Status: DC
  Administered 2020-07-22 – 2020-07-23 (×2): 1 [drp] via OPHTHALMIC
  Filled 2020-07-22: qty 2.5

## 2020-07-22 MED ORDER — LISINOPRIL 10 MG PO TABS
10.0000 mg | ORAL_TABLET | Freq: Every day | ORAL | Status: DC
  Administered 2020-07-23 – 2020-07-24 (×2): 10 mg via ORAL
  Filled 2020-07-22 (×2): qty 1

## 2020-07-22 MED ORDER — ACETAMINOPHEN 325 MG PO TABS
650.0000 mg | ORAL_TABLET | Freq: Four times a day (QID) | ORAL | Status: DC | PRN
  Administered 2020-07-22: 650 mg via ORAL
  Filled 2020-07-22: qty 2

## 2020-07-22 MED ORDER — ADULT MULTIVITAMIN W/MINERALS CH
1.0000 | ORAL_TABLET | Freq: Every day | ORAL | Status: DC
  Administered 2020-07-23 – 2020-07-24 (×2): 1 via ORAL
  Filled 2020-07-22 (×2): qty 1

## 2020-07-22 NOTE — ED Notes (Signed)
ECHO at bedside.

## 2020-07-22 NOTE — Progress Notes (Signed)
  Echocardiogram 2D Echocardiogram with 3D has been performed.  Leta Jungling M 07/22/2020, 4:52 PM

## 2020-07-22 NOTE — H&P (Addendum)
ELECTROPHYSIOLOGY CONSULT NOTE    Patient ID: Lamine Laton MRN: [id number], DOB/AGE: 84-Aug-1931 84 y.o.  Admit date: 07/22/2020 Date of Consult: 07/22/2020  Primary Physician: Gordan Payment., MD Primary Cardiologist: Dr. Tomie China Electrophysiologist:  New  Reason for Admission: Syncope : CHB by monitor  Patient Profile: Baldo Hufnagle is a 86 y.o. male with a history of CAD s/p CABG, atrial flutter not on OAC due to "frequent falls and memory issues", syncope, HLD, Alzheimers, and DM2 who is being seen today for the evaluation of syncope and CHB on monitor at the request of Dr. Tomie China, who sent patient to ED based on monitor results.  HPI:  Kennett Symes is a 86 y.o. male with medical history above. He was seen in Dr. Kem Parkinson office 06/23/2020 as part of regular follow up. His son mentioned that patient had syncope while sitting eating breakfast. He came to after a few seconds at his normal baseline, without post-ictal state.  He had at least one other episode of unwitnessed syncope, where he told his son he passed out and fell at his home.  He otherwise lives at home and is very active around the house and in his yard.   2 week Zio was placed.   Zio patch resulted today and showed 161 total pauses. Ranging from 3 seconds to 14.1 seconds. Pt denies syncope or lightheadedness while wearing monitor. Pt has approx 7 second pause one evening around 5 pm. He remembers know specific symptoms. His son states that his memory is so bad, it's very possible he would've had a syncopal episode and forget to tell anyone. Son states that pt has confusion at times, but no h/o agitation.   He denies chest pain (distant CABG), edema, orthopnea, or DOE.     Son was not aware of diagnosis of atrial flutter. Discussion in chart per Dr. Tomie China, pt opted out of [facility] due to "falls and forgetfulness". We reviewed the risk of stroke and pt and son are content to continue the conversation re:  [facility] with Dr. Tomie China once healed from pacer.   Past Medical History:  Diagnosis Date   Actinic keratoses 12/07/2017   Atrial flutter (HCC) 02/26/2018   Back pain 12/01/2015   Benign prostatic hyperplasia without lower urinary tract symptoms 06/30/2016   Surgery by [facility] in 2016.   CAD in native artery 02/18/2015   CKD (chronic kidney disease) stage 3, GFR 30-59 ml/min (HCC) 04/23/2018   Essential (primary) hypertension 02/18/2015   Generalized anxiety disorder 06/29/2016   H/O heart artery stent 04/12/2017   High risk medication use 06/29/2016   Hypercholesteremia    Hypertension    Late onset Alzheimer's disease without behavioral disturbance (HCC) 06/29/2016   Malaise and fatigue 06/29/2016   Mixed hyperlipidemia 06/29/2016   Orthostatic hypotension 06/29/2016   Osteomyelitis of lumbar spine (HCC) 10/29/2015   Senile purpura (HCC) 12/04/2018   Severe hearing loss of both ears 06/29/2016   Spinal epidural abscess 10/29/2015   Syncope 06/29/2016   Tremor 06/29/2016   Type 2 diabetes mellitus with stage 3 chronic kidney disease, without long-term current use of insulin (HCC) 06/29/2016   Vertebral osteomyelitis (HCC)    Vitamin D deficiency 06/29/2016     Surgical History:  Past Surgical History:  Procedure Laterality Date   CORONARY STENT PLACEMENT     PROCTECTOMY       (Not in a hospital admission)   Inpatient Medications:   sodium chloride flush  3 mL Intravenous Q12H  Allergies: No Known Allergies  Social History   Socioeconomic History   Marital status: Single    Spouse name: Not on file   Number of children: Not on file   Years of education: Not on file   Highest education level: Not on file  Occupational History   Not on file  Tobacco Use   Smoking status: Former Smoker    Packs/day: 1.00    Years: 20.00    Pack years: 20.00    Types: Cigarettes    Quit date: 10/29/1978    Years since quitting: 41.7   Smokeless  tobacco: Never Used  Vaping Use   Vaping Use: Never used  Substance and Sexual Activity   Alcohol use: No    Alcohol/week: 0.0 standard drinks   Drug use: No   Sexual activity: Not on file  Other Topics Concern   Not on file  Social History Narrative   Not on file   Social Determinants of Health   Financial Resource Strain: Not on file  Food Insecurity: Not on file  Transportation Needs: Not on file  Physical Activity: Not on file  Stress: Not on file  Social Connections: Not on file  Intimate Partner Violence: Not on file     Family History  Problem Relation Age of Onset   Colon cancer Mother      Review of Systems: All other systems reviewed and are otherwise negative except as noted above.  Physical Exam: Vitals:   07/22/20 1232 07/22/20 1340 07/22/20 1438 07/22/20 1500  BP: 115/69 136/78 (!) 172/94 (!) 158/122  Pulse: 62 63 61 (!) 57  Resp: 17 12 19  (!) 22  Temp: 97.8 F (36.6 C) 98.3 F (36.8 C)    TempSrc: Oral Oral    SpO2: 99% 98% 100% 99%    GEN- The patient is elderly appearing, alert and oriented x 3 today.  Somewhat HOH HEENT: normocephalic, atraumatic; sclera clear, conjunctiva pink; hearing intact; oropharynx clear; neck supple Lungs- Clear to ausculation bilaterally, normal work of breathing.  No wheezes, rales, rhonchi Heart- Regular rate and rhythm, no murmurs, rubs or gallops GI- soft, non-tender, non-distended, bowel sounds present Extremities- no clubbing, cyanosis, or edema; DP/PT/radial pulses 2+ bilaterally MS- no significant deformity or atrophy Skin- warm and dry, no rash or lesion Psych- euthymic mood, full affect Neuro- strength and sensation are intact  Labs:   Lab Results  Component Value Date   WBC 5.8 07/22/2020   HGB 14.3 07/22/2020   HCT 43.8 07/22/2020   MCV 101.2 (H) 07/22/2020   PLT 155 07/22/2020    Recent Labs  Lab 07/22/20 1124  NA 137  K 4.9  CL 104  CO2 25  BUN 21  CREATININE 1.24  CALCIUM 9.7   GLUCOSE 112*      Radiology/Studies: DG Chest 2 View  Result Date: 07/22/2020 CLINICAL DATA:  Arrhythmia, syncopal episode. EXAM: CHEST - 2 VIEW COMPARISON:  CT chest 06/03/2020 FINDINGS: The heart size and mediastinal contours are within normal limits. Aorta atherosclerotic plaque. Flattening of the hemidiaphragms consistent with emphysema. Pericentimeter round density overlying the left lower lung zone likely represents a nipple shadow. No focal consolidation. No pulmonary edema. No pleural effusion. No pneumothorax. No acute osseous abnormality. Multilevel degenerative changes of the spine. IMPRESSION: 1. No active cardiopulmonary disease. 2. Pericentimeter round density overlying the left lower lung zone likely represents a nipple shadow. Consider use of nipple markers on follow-up chest x-ray. 3. Aortic Atherosclerosis (ICD10-I70.0) and Emphysema (ICD10-J43.9). Electronically  Signed   By: Tish Frederickson M.D.   On: 07/22/2020 11:59    OFB:PZWCHE flutter with controlled VR in 60s (personally reviewed)  TELEMETRY: Atrial flutter with controlled VR (personally reviewed)  Assessment/Plan: 1.  Syncope with pauses of up to 14 seconds on monitor / CHB Monitor includes daytime pauses of up to 7 seconds. Son is not sure that pt would have the wherewithal to report a syncope depending on the circumstances.  No reversible cause of CHB noted.  Pt with h/o CABG and stenting by report and chart. Update echo. Explained risks, benefits, and alternatives to PPM implantation, including but not limited to bleeding, infection, pneumothorax, pericardial effusion, lead dislodgement, heart attack, stroke, or death.  Pt verbalized understanding and agrees to proceed if indicated.   2. Atrial flutter Since at least 02/2018. Rates controlled. No role for rhythm control at this time.  In the past, he has declined OAC in setting of falls and memory issues.  Discussed risk of stroke with patient and son at length;  they Reese Senk follow up with Dr. Tomie China for further discussion.  CHA2DS2VASC of at least 5. No h/o stroke.     3. CAD s/p CABG Denies ischemic symptoms Update echo  4.  DM2 Not on insulin at home.   5. Alzheimers At baseline, pt is forgetful, especially long term.  Otherwise very independent and able to do ADLs without assistance.  Continue aricept.   6.  Stage IIIb CKD: Has remained stable.  No changes.  Have discussed case with Dr. Elberta Fortis who Raenah Murley see. Have placed on board for Dr. Ladona Ridgel to pace tomorrow.   For questions or updates, please contact [facility] Please consult [email or web address] for contact info under Cardiology/STEMI.  Signed, Graciella Freer, PA-C  07/22/2020 4:04 PM     I have seen and examined this patient with Otilio Saber.  Agree with above, note added to reflect my findings.  On exam, irregular, no murmurs.  Patient has a history of episodes of syncope.  He wore a cardiac monitor that showed 14-second nocturnal pauses but also 7-second pauses during the day.  The patient has Alzheimer's dementia and thus does not remember his episodes.  This history was taken via the patient's son.  The patient was referred to the hospital after the monitor results were obtained.  He currently feels well.  No reversible causes found.  We Hilario Robarts plan for pacemaker implant tomorrow.  Monna Crean M. Jac Romulus MD 07/22/2020 6:19 PM

## 2020-07-22 NOTE — ED Triage Notes (Signed)
Pt sent here by cardiologists office for significant pauses noted on his halter monitor. Pt had monitor placed due to syncopal episodes. Pt a.o, has no complaints at this time.

## 2020-07-22 NOTE — ED Notes (Signed)
Pt found out of bed trying to clean room, he stated he spilled something. Pt re oriented to surroundings

## 2020-07-22 NOTE — Telephone Encounter (Signed)
Talked wit Dr. Tomie China regarding abnormal zio and decision was made to cancel the echo today and have pt go to the ED at [facility] for possible pacemaker. Spoke with pt to advise him to go to the ED for abnormal Zio report. Pt states that he is "better than ever" and hung up the phone. Pt has some cognitive issues so I called his next of kni which was his daughter. I advised her of the same and she had her brother to call me. I provided him with the same info and that he needed to take his dad to the ED at [facility] for the abnormal zio report. Called and advised the triage nurse that the pt was coming in POV with his son and the reason he was coming.

## 2020-07-22 NOTE — ED Notes (Signed)
Attempted to call report, nurse is in report and cant take it at this time

## 2020-07-22 NOTE — ED Notes (Signed)
Dinner ordered 

## 2020-07-22 NOTE — ED Provider Notes (Addendum)
[facility] Provider Note   CSN: [id number] Arrival date & time: 07/22/20  1110     History Chief Complaint  Patient presents with  . Irregular Heart Beat    Jacob Benitez is a 86 y.o. male who presents at the prompting of his cardiologist for abnormal ZIO Holter monitor results.  Patient was placed on monitor due to multiple syncopal episodes in the last month; results are reported today with concern for prolonged pauses.  Per outpatient cardiologist (Dr. Tomie Chinaevankar) patient was directed to the emergency department to be admitted for placement of pacemaker.  Zio report: Holter monitor results revealed continuous atrial fibrillation with heart rate ranging from 25-119 beats per minute.  There were 161 pauses that occurred in the 14-day period that the patient was wearing the Holter monitor, the longest lasting 14.1 seconds (heart rate of 4 bpm). Patient had outpatient echocardiogram scheduled for today, however his cardiologist canceled it and requested that he present to the emergency department for further evaluation and likely pacemaker implantation.  Patient is denying chest pain, shortness of breath, palpitations, abdominal pain, nausea, vomiting at this time.  Patient is giggly in the room, very hard of hearing.  He is not wearing his hearing aids at this time.  His son [name] is at the bedside who was able to provide collateral information.  Patient lives alone, however his son lives directly across the street from him, and states that they see him daily.  He states that he has been acting like himself; most recent witnessed syncopal episode was about 3 weeks ago.  I personally reviewed this patient's medical records.  He has history of CAD, hyperlipidemia, type 2 diabetes with CKD, atrial flutter, and osteomyelitis of the lumbar spine with spinal epidural abscess, as well as Alzheimer's disease.  Patient is on aspirin and Plavix.  HPI      Past Medical History:  Diagnosis Date  . Actinic keratoses 12/07/2017  . Atrial flutter (HCC) 02/26/2018  . Back pain 12/01/2015  . Benign prostatic hyperplasia without lower urinary tract symptoms 06/30/2016   Surgery by [facility] in 2016.  Marland Kitchen. CAD in native artery 02/18/2015  . CKD (chronic kidney disease) stage 3, GFR 30-59 ml/min (HCC) 04/23/2018  . Essential (primary) hypertension 02/18/2015  . Generalized anxiety disorder 06/29/2016  . H/O heart artery stent 04/12/2017  . High risk medication use 06/29/2016  . Hypercholesteremia   . Hypertension   . Late onset Alzheimer's disease without behavioral disturbance (HCC) 06/29/2016  . Malaise and fatigue 06/29/2016  . Mixed hyperlipidemia 06/29/2016  . Orthostatic hypotension 06/29/2016  . Osteomyelitis of lumbar spine (HCC) 10/29/2015  . Senile purpura (HCC) 12/04/2018  . Severe hearing loss of both ears 06/29/2016  . Spinal epidural abscess 10/29/2015  . Syncope 06/29/2016  . Tremor 06/29/2016  . Type 2 diabetes mellitus with stage 3 chronic kidney disease, without long-term current use of insulin (HCC) 06/29/2016  . Vertebral osteomyelitis (HCC)   . Vitamin D deficiency 06/29/2016    Patient Active Problem List   Diagnosis Date Noted  . Complete heart block (HCC) 07/22/2020  . Hypercholesteremia   . Hypertension   . Senile purpura (HCC) 12/04/2018  . CKD (chronic kidney disease) stage 3, GFR 30-59 ml/min (HCC) 04/23/2018  . Atrial flutter (HCC) 02/26/2018  . Actinic keratoses 12/07/2017  . H/O heart artery stent 04/12/2017  . Benign prostatic hyperplasia without lower urinary tract symptoms 06/30/2016  . Generalized anxiety disorder 06/29/2016  . High  risk medication use 06/29/2016  . Late onset Alzheimer's disease without behavioral disturbance (HCC) 06/29/2016  . Malaise and fatigue 06/29/2016  . Mixed hyperlipidemia 06/29/2016  . Orthostatic hypotension 06/29/2016  . Severe hearing loss of both ears 06/29/2016  . Tremor  06/29/2016  . Type 2 diabetes mellitus with stage 3 chronic kidney disease, without long-term current use of insulin (HCC) 06/29/2016  . Vitamin D deficiency 06/29/2016  . Syncope 06/29/2016  . Back pain 12/01/2015  . Vertebral osteomyelitis (HCC)   . Spinal epidural abscess 10/29/2015  . Osteomyelitis of lumbar spine (HCC) 10/29/2015  . CAD in native artery 02/18/2015  . Essential (primary) hypertension 02/18/2015    Past Surgical History:  Procedure Laterality Date  . CORONARY STENT PLACEMENT    . PROCTECTOMY         Family History  Problem Relation Age of Onset  . Colon cancer Mother     Social History   Tobacco Use  . Smoking status: Former Smoker    Packs/day: 1.00    Years: 20.00    Pack years: 20.00    Types: Cigarettes    Quit date: 10/29/1978    Years since quitting: 41.7  . Smokeless tobacco: Never Used  Vaping Use  . Vaping Use: Never used  Substance Use Topics  . Alcohol use: No    Alcohol/week: 0.0 standard drinks  . Drug use: No    Home Medications Prior to Admission medications   Medication Sig Start Date End Date Taking? Authorizing Provider  aspirin EC 81 MG tablet Take 81 mg by mouth daily.    Yes [provider]  brimonidine (ALPHAGAN P) 0.1 % SOLN Place 1 drop into both eyes 3 (three) times daily.   Yes [provider]  clopidogrel (PLAVIX) 75 MG tablet Take 75 mg by mouth daily. 09/13/16  Yes [provider]  donepezil (ARICEPT) 10 MG tablet Take 20 mg by mouth daily.  10/22/15  Yes [provider]  dorzolamide (TRUSOPT) 2 % ophthalmic solution Place 1 drop into both eyes in the morning and at bedtime.   Yes [provider]  ibuprofen (ADVIL,MOTRIN) 200 MG tablet Take 200 mg by mouth every 6 (six) hours as needed for moderate pain.   Yes [provider]  latanoprost (XALATAN) 0.005 % ophthalmic solution Place 1 drop into both eyes at bedtime.   Yes [provider]  lisinopril  (ZESTRIL) 10 MG tablet Take 1 tablet (10 mg total) by mouth daily. 06/23/20  Yes Revankar, Aundra Dubin, MD  Multiple Vitamin (MULTIVITAMIN) capsule Take 1 capsule by mouth daily.   Yes [provider]  Multiple Vitamins-Minerals (ICAPS AREDS FORMULA PO) Take 1 tablet by mouth in the morning and at bedtime.   Yes [provider]  simvastatin (ZOCOR) 40 MG tablet Take 40 mg by mouth daily. 12/04/17  Yes [provider]  nitroGLYCERIN (NITROSTAT) 0.4 MG SL tablet Place 1 tablet (0.4 mg total) under the tongue every 5 (five) minutes as needed for chest pain. Patient not taking: Reported on 07/22/2020 09/20/18 05/13/24  Revankar, Aundra Dubin, MD    Allergies    Patient has no known allergies.  Review of Systems   Review of Systems  Unable to perform ROS: Dementia (Additionally, Patient is hard of hearing)  Constitutional: Negative for activity change, chills, fatigue and fever.  HENT: Negative.   Eyes: Negative.   Respiratory: Negative for cough, choking and shortness of breath.   Cardiovascular: Negative for chest pain, palpitations  and leg swelling.  Gastrointestinal: Negative.   Neurological: Positive for syncope.    Physical Exam Updated Vital Signs BP (!) 158/122   Pulse (!) 57   Temp 98.3 F (36.8 C) (Oral)   Resp (!) 22   SpO2 99%   Physical Exam Vitals and nursing note reviewed.  HENT:     Head: Normocephalic and atraumatic.     Right Ear: Tympanic membrane and ear canal normal. Decreased hearing noted.     Left Ear: Tympanic membrane and ear canal normal. Decreased hearing noted.     Ears:      Nose: Nose normal.     Mouth/Throat:     Mouth: Mucous membranes are moist.     Pharynx: No oropharyngeal exudate or posterior oropharyngeal erythema.  Eyes:     General:        Right eye: No discharge.        Left eye: No discharge.     Conjunctiva/sclera: Conjunctivae normal.     Pupils: Pupils are equal, round, and reactive to light.  Cardiovascular:      Rate and Rhythm: Normal rate. Rhythm irregular.     Pulses:          Radial pulses are 2+ on the right side and 2+ on the left side.       Dorsalis pedis pulses are 1+ on the right side and 1+ on the left side.     Heart sounds: Normal heart sounds.  Pulmonary:     Effort: Pulmonary effort is normal. No respiratory distress.     Breath sounds: Examination of the right-lower field reveals wheezing. Examination of the left-lower field reveals wheezing. Wheezing present. No rales.  Chest:     Chest wall: No deformity, swelling, tenderness, crepitus or edema.  Abdominal:     General: Bowel sounds are normal. There is no distension.     Palpations: Abdomen is soft.     Tenderness: There is no abdominal tenderness.  Musculoskeletal:        General: No deformity.     Cervical back: Neck supple. No rigidity or tenderness.     Right lower leg: No edema.     Left lower leg: No edema.  Lymphadenopathy:     Cervical: No cervical adenopathy.  Skin:    General: Skin is warm and dry.  Neurological:     Mental Status: He is alert. Mental status is at baseline. He is confused.  Psychiatric:        Mood and Affect: Mood normal.     ED Results / Procedures / Treatments   Labs (all labs ordered are listed, but only abnormal results are displayed) Labs Reviewed  BASIC METABOLIC PANEL - Abnormal; Notable for the following components:      Result Value   Glucose, Bld 112 (*)    GFR, Estimated 56 (*)    All other components within normal limits  CBC - Abnormal; Notable for the following components:   MCV 101.2 (*)    All other components within normal limits  RESP PANEL BY RT-PCR (FLU A&B, COVID) [id number]    EKG EKG Interpretation  Date/Time:  Wednesday July 22 2020 11:11:51 EST Ventricular Rate:  62 PR Interval:    QRS Duration: 100 QT Interval:  452 QTC Calculation: 458 R Axis:   -86 Text Interpretation: Atrial fibrillation Left axis deviation Incomplete right bundle branch block  Septal infarct , age undetermined Abnormal ECG No acute changes No significant change since  last tracing Confirmed by Derwood Kaplan [phone]) on 07/22/2020 3:49:48 PM   Radiology DG Chest 2 View  Result Date: 07/22/2020 CLINICAL DATA:  Arrhythmia, syncopal episode. EXAM: CHEST - 2 VIEW COMPARISON:  CT chest 06/03/2020 FINDINGS: The heart size and mediastinal contours are within normal limits. Aorta atherosclerotic plaque. Flattening of the hemidiaphragms consistent with emphysema. Pericentimeter round density overlying the left lower lung zone likely represents a nipple shadow. No focal consolidation. No pulmonary edema. No pleural effusion. No pneumothorax. No acute osseous abnormality. Multilevel degenerative changes of the spine. IMPRESSION: 1. No active cardiopulmonary disease. 2. Pericentimeter round density overlying the left lower lung zone likely represents a nipple shadow. Consider use of nipple markers on follow-up chest x-ray. 3. Aortic Atherosclerosis (ICD10-I70.0) and Emphysema (ICD10-J43.9). Electronically Signed   By: Tish Frederickson M.D.   On: 07/22/2020 11:59    Procedures .Critical Care Performed by: Paris Lore, PA-C Authorized by: Paris Lore, PA-C   Critical care provider statement:    Critical care time (minutes):  45   Critical care was necessary to treat or prevent imminent or life-threatening deterioration of the following conditions: symptomatic bradycardia.   Critical care was time spent personally by me on the following activities:  Discussions with consultants, examination of patient, ordering and performing treatments and interventions, ordering and review of laboratory studies, ordering and review of radiographic studies, pulse oximetry, re-evaluation of patient's condition, obtaining history from patient or surrogate and review of old charts   (including critical care time)  Medications Ordered in ED Medications  sodium chloride flush (NS)  0.9 % injection 3 mL (has no administration in time range)    ED Course  I have reviewed the triage vital signs and the nursing notes.  Pertinent labs & imaging results that were available during my care of the patient were reviewed by me and considered in my medical decision making (see chart for details).    MDM Rules/Calculators/A&P                         86 year old male with history of CAD, hypertension, syncopal episodes who presents at the prompting of his cardiologist for abnormal Zio report with significant pauses (up to 14 seconds in length).  Outpatient cardiologist recommending admission for possible pacer placement.   Patient bradycardic on intake with heart rate 51, hypertensive to 162/76.  Vital signs otherwise normal.  Physical exam significant for irregular cardiac rhythm, mild wheezing in the lung bases; otherwise without acute abnormality.  Basic laboratory studies obtained in triage.  CBC unremarkable, BMP unremarkable.  Chest x-ray Negative for acute cardiopulmonary disease.  Atherosclerosis of the aorta, emphysema.  EKG with Afib, incomplete RBBB, no change from prior.   Consult placed to cardiology; I spoke with Trish the cardiology flow manager, who was agreeable to adding this patient to cardiology list for evaluation and admission.  Patient has been admitted to cardiology service, Dr. Ladona Ridgel. I appreciate his collaboration care of this patient.  Jamel voiced understanding of his medical evaluation and treatment plan. Each of his questions were answered to his expressed satisfaction. He is agreeable with plan for admission to the hospital at this time.  Final Clinical Impression(s) / ED Diagnoses Final diagnoses:  None    Rx / DC Orders ED Discharge Orders    None       Paris Lore, PA-C 07/22/20 1610    Itzel Mckibbin, Eugene Gavia, PA-C 07/22/20 1621    Nanavati,  Ankit, MD 07/23/20 1058

## 2020-07-23 ENCOUNTER — Inpatient Hospital Stay (HOSPITAL_COMMUNITY)
Admission: EM | Disposition: A | Discharge status: Home / Self Care | Payer: Self-pay | Source: Home / Self Care | Attending: Internal Medicine

## 2020-07-23 DIAGNOSIS — I4892 Unspecified atrial flutter: Secondary | ICD-10-CM

## 2020-07-23 HISTORY — PX: PACEMAKER IMPLANT: EP1218

## 2020-07-23 LAB — CBC
HCT: 39.1 % (ref 39.0–52.0)
Hemoglobin: 12.9 g/dL — ABNORMAL LOW (ref 13.0–17.0)
MCH: 32.7 pg (ref 26.0–34.0)
MCHC: 33 g/dL (ref 30.0–36.0)
MCV: 99 fL (ref 80.0–100.0)
Platelets: 136 10*3/uL — ABNORMAL LOW (ref 150–400)
RBC: 3.95 MIL/uL — ABNORMAL LOW (ref 4.22–5.81)
RDW: 13.2 % (ref 11.5–15.5)
WBC: 6 10*3/uL (ref 4.0–10.5)
nRBC: 0 % (ref 0.0–0.2)

## 2020-07-23 LAB — BASIC METABOLIC PANEL
Anion gap: 9 (ref 5–15)
BUN: 23 mg/dL (ref 8–23)
CO2: 23 mmol/L (ref 22–32)
Calcium: 9.2 mg/dL (ref 8.9–10.3)
Chloride: 107 mmol/L (ref 98–111)
Creatinine, Ser: 1.51 mg/dL — ABNORMAL HIGH (ref 0.61–1.24)
GFR, Estimated: 44 mL/min — ABNORMAL LOW (ref >60)
Glucose, Bld: 119 mg/dL — ABNORMAL HIGH (ref 70–99)
Potassium: 4.5 mmol/L (ref 3.5–5.1)
Sodium: 139 mmol/L (ref 135–145)

## 2020-07-23 LAB — GLUCOSE, CAPILLARY: Glucose-Capillary: 119 mg/dL — ABNORMAL HIGH (ref 70–99)

## 2020-07-23 SURGERY — PACEMAKER IMPLANT

## 2020-07-23 MED ORDER — LIDOCAINE HCL (PF) 1 % IJ SOLN
INTRAMUSCULAR | Status: AC
  Filled 2020-07-23: qty 30

## 2020-07-23 MED ORDER — SODIUM CHLORIDE 0.9 % IV SOLN
INTRAVENOUS | Status: AC
  Filled 2020-07-23: qty 2

## 2020-07-23 MED ORDER — HEPARIN (PORCINE) IN NACL 1000-0.9 UT/500ML-% IV SOLN
INTRAVENOUS | Status: AC
  Filled 2020-07-23: qty 500

## 2020-07-23 MED ORDER — CHLORHEXIDINE GLUCONATE 4 % EX LIQD
60.0000 mL | Freq: Once | CUTANEOUS | Status: AC
  Administered 2020-07-22: 4 via TOPICAL

## 2020-07-23 MED ORDER — GENTAMICIN SULFATE 40 MG/ML IJ SOLN
80.0000 mg | INTRAMUSCULAR | Status: AC
Start: 2020-07-23 — End: 2020-07-23
  Administered 2020-07-23: 14:00:00 80 mg

## 2020-07-23 MED ORDER — CHLORHEXIDINE GLUCONATE 4 % EX LIQD
60.0000 mL | Freq: Once | CUTANEOUS | Status: AC
Start: 2020-07-23 — End: 2020-07-23
  Administered 2020-07-23: 4 via TOPICAL
  Filled 2020-07-23: qty 15

## 2020-07-23 MED ORDER — MUPIROCIN 2 % EX OINT
1.0000 "application " | TOPICAL_OINTMENT | Freq: Two times a day (BID) | CUTANEOUS | Status: DC
  Administered 2020-07-23 – 2020-07-24 (×4): 1 via NASAL
  Filled 2020-07-23 (×2): qty 22

## 2020-07-23 MED ORDER — CEFAZOLIN SODIUM-DEXTROSE 1-4 GM/50ML-% IV SOLN
1.0000 g | Freq: Two times a day (BID) | INTRAVENOUS | Status: AC
  Administered 2020-07-24 (×2): 1 g via INTRAVENOUS
  Filled 2020-07-23 (×2): qty 50

## 2020-07-23 MED ORDER — LIDOCAINE HCL (PF) 1 % IJ SOLN
INTRAMUSCULAR | Status: DC | PRN
  Administered 2020-07-23: 50 mL

## 2020-07-23 MED ORDER — CEFAZOLIN SODIUM-DEXTROSE 2-4 GM/100ML-% IV SOLN
INTRAVENOUS | Status: AC
  Filled 2020-07-23: qty 100

## 2020-07-23 MED ORDER — ONDANSETRON HCL 4 MG/2ML IJ SOLN: 4.0000 mg | Freq: Four times a day (QID) | INTRAMUSCULAR | Status: DC | PRN

## 2020-07-23 MED ORDER — CHLORHEXIDINE GLUCONATE CLOTH 2 % EX PADS
6.0000 | MEDICATED_PAD | Freq: Every day | CUTANEOUS | Status: DC
  Administered 2020-07-23 – 2020-07-24 (×2): 6 via TOPICAL

## 2020-07-23 MED ORDER — ACETAMINOPHEN 325 MG PO TABS
325.0000 mg | ORAL_TABLET | ORAL | Status: DC | PRN
Start: 2020-07-23 — End: 2020-07-25

## 2020-07-23 MED ORDER — CEFAZOLIN SODIUM-DEXTROSE 2-4 GM/100ML-% IV SOLN
2.0000 g | INTRAVENOUS | Status: AC
  Administered 2020-07-23: 14:00:00 2 g via INTRAVENOUS

## 2020-07-23 MED ORDER — SODIUM CHLORIDE 0.9 % IV SOLN: INTRAVENOUS | Status: DC

## 2020-07-23 MED ORDER — HEPARIN (PORCINE) IN NACL 1000-0.9 UT/500ML-% IV SOLN
INTRAVENOUS | Status: DC | PRN
  Administered 2020-07-23: 500 mL

## 2020-07-23 SURGICAL SUPPLY — 9 items
CABLE SURGICAL S-101-97-12 (CABLE) ×3 IMPLANT
CATH SELECT PACE 669183 (CATHETERS) ×3 IMPLANT
CUTTER LV DELIVERY CATHETER 7 (MISCELLANEOUS) ×3 IMPLANT
LEAD INGEVITY 7842 59 (Lead) ×3 IMPLANT
PACEMAKER ACCOLADE SR (Pacemaker) ×3 IMPLANT
PAD PRO RADIOLUCENT 2001M-C (PAD) ×3 IMPLANT
SHEATH 8FR PRELUDE SNAP 13 (SHEATH) ×3 IMPLANT
TRAY PACEMAKER INSERTION (PACKS) ×3 IMPLANT
WIRE HI TORQ VERSACORE-J 145CM (WIRE) ×3 IMPLANT

## 2020-07-23 NOTE — H&P (View-Only) (Signed)
Electrophysiology Rounding Note  Patient Name: Jacob Benitez Date of Encounter: 07/23/2020  Primary Cardiologist: No primary care provider on file. Electrophysiologist: New   Subjective   The patient is doing well today.  At this time, the patient denies chest pain, shortness of breath, or any new concerns.  He vaguely remembers our discussion surrounding pacing yesterday. He would still like to proceed.   Inpatient Medications    Scheduled Meds: . aspirin EC  81 mg Oral Daily  . brimonidine  1 drop Both Eyes TID  . chlorhexidine  60 mL Topical Once  . Chlorhexidine Gluconate Cloth  6 each Topical Q0600  . dorzolamide  1 drop Both Eyes BID  . gentamicin irrigation  80 mg Irrigation To SSTC  . latanoprost  1 drop Both Eyes QHS  . lisinopril  10 mg Oral Daily  . multivitamin with minerals  1 tablet Oral Daily  . mupirocin ointment  1 application Nasal BID  . simvastatin  40 mg Oral Daily  . sodium chloride flush  3 mL Intravenous Q12H   Continuous Infusions: . sodium chloride    . sodium chloride 50 mL/hr at 07/23/20 0544  .  ceFAZolin (ANCEF) IV     PRN Meds: acetaminophen, nitroGLYCERIN   Vital Signs    Vitals:   07/22/20 2306 07/23/20 0000 07/23/20 0307 07/23/20 0400  BP: (!) 117/59 (!) 101/57 130/69 116/74  Pulse: (!) 58 (!) 58 (!) 58 (!) 56  Resp: 16 15 13 15   Temp: 98.2 F (36.8 C)  98 F (36.7 C)   TempSrc: Oral  Oral   SpO2: 96% 95% 97% 97%  Weight:    73.2 kg    Intake/Output Summary (Last 24 hours) at 07/23/2020 0859 Last data filed at 07/23/2020 0500 Gross per 24 hour  Intake --  Output 450 ml  Net -450 ml   Filed Weights   07/22/20 1949 07/23/20 0400  Weight: 74.4 kg 73.2 kg    Physical Exam    GEN- The patient is well appearing, alert and oriented x 3 today.   Head- normocephalic, atraumatic Eyes-  Sclera clear, conjunctiva pink Ears- hearing intact Oropharynx- clear Neck- supple Lungs- Clear to ausculation bilaterally,  normal work of breathing Heart- IRR no murmurs, rubs or gallops GI- soft, NT, ND, + BS Extremities- no clubbing or cyanosis. No edema Skin- no rash or lesion Psych- euthymic mood, full affect Neuro- strength and sensation are intact  Labs    CBC Recent Labs    07/22/20 1124 07/23/20 0034  WBC 5.8 6.0  HGB 14.3 12.9*  HCT 43.8 39.1  MCV 101.2* 99.0  PLT 155 136*   Basic Metabolic Panel Recent Labs    07/25/20 1124 07/23/20 0034  NA 137 139  K 4.9 4.5  CL 104 107  CO2 25 23  GLUCOSE 112* 119*  BUN 21 23  CREATININE 1.24 1.51*  CALCIUM 9.7 9.2   Liver Function Tests No results for input(s): AST, ALT, ALKPHOS, BILITOT, PROT, ALBUMIN in the last 72 hours. No results for input(s): LIPASE, AMYLASE in the last 72 hours. Cardiac Enzymes No results for input(s): CKTOTAL, CKMB, CKMBINDEX, TROPONINI in the last 72 hours.   Telemetry    AF with slow to controlled ventricular rates, as low as 30s overnight , mostly 50-60s (personally reviewed)  Radiology    DG Chest 2 View  Result Date: 07/22/2020 CLINICAL DATA:  Arrhythmia, syncopal episode. EXAM: CHEST - 2 VIEW COMPARISON:  CT chest 06/03/2020  FINDINGS: The heart size and mediastinal contours are within normal limits. Aorta atherosclerotic plaque. Flattening of the hemidiaphragms consistent with emphysema. Pericentimeter round density overlying the left lower lung zone likely represents a nipple shadow. No focal consolidation. No pulmonary edema. No pleural effusion. No pneumothorax. No acute osseous abnormality. Multilevel degenerative changes of the spine. IMPRESSION: 1. No active cardiopulmonary disease. 2. Pericentimeter round density overlying the left lower lung zone likely represents a nipple shadow. Consider use of nipple markers on follow-up chest x-ray. 3. Aortic Atherosclerosis (ICD10-I70.0) and Emphysema (ICD10-J43.9). Electronically Signed   By: Tish Frederickson M.D.   On: 07/22/2020 11:59    Patient Profile      Jacob Benitez is a 86 y.o. male with a history of CAD s/p CABG, atrial flutter not on OAC due to "frequent falls and memory issues", syncope, HLD, Alzheimers, and DM2 who is being seen today for the evaluation of syncope and CHB on monitor at the request of Dr. Tomie China, who sent patient to ED based on monitor results.  Assessment & Plan    1.  Syncope with pauses of up to 14 seconds on monitor / CHB Monitor includes daytime pauses of up to 7 seconds. Son is not sure that pt would have the wherewithal to report a syncope depending on the circumstances.  No reversible cause of CHB noted.  Pt with h/o CABG and stenting by report and chart. Update echo. Explained risks, benefits, and alternatives to PPM implantation to the patient and his son, who is POA. Risks include, but not limited to bleeding, infection, pneumothorax, pericardial effusion, lead dislodgement, heart attack, stroke, or death.  They have both verbalized understanding and agreed to proceed.  2. Longstanding persistent Atrial flutter Since at least 02/2018. Rates controlled to slow. No role for rhythm control at this time.  In the past, he has declined OAC in setting of falls and memory issues.  Discussed risk of stroke with patient and son at length; they will follow up with Dr. Tomie China for further discussion.  CHA2DS2VASC of at least 5. No h/o stroke.    Stable.   3. CAD s/p CABG Denies ischemic symptoms Echo performed 12/15; results pending  4.  DM2 Not on insulin at home.  Per PCP on d/c.  5. Alzheimers At baseline, pt is forgetful, especially long term.  Otherwise very independent and able to do ADLs without assistance.  Continue aricept.  Have discussed risks and benefits of PPM insertion with his son/POA as well.    For questions or updates, please contact [facility] Please consult [email or web address] for contact info under Cardiology/STEMI.  Signed, Graciella Freer, PA-C  07/23/2020, 8:59 AM    EP Attending  Patient seen and examined. Agree with above. The patient presents with syncope and long pauses in the setting of chronic reverse typical atrial flutter. He has dementia and I have discussed the inidcations/risks/benefits/goals/expectations of PPM insertion and he wishes to proceed.  Sharlot Gowda Malayna Noori,MD

## 2020-07-23 NOTE — Interval H&P Note (Signed)
History and Physical Interval Note:  07/23/2020 1:21 PM  Jacob Benitez  has presented today for surgery, with the diagnosis of heart block.  The various methods of treatment have been discussed with the patient and family. After consideration of risks, benefits and other options for treatment, the patient has consented to  Procedure(s): PACEMAKER IMPLANT (N/A) as a surgical intervention.  The patient's history has been reviewed, patient examined, no change in status, stable for surgery.  I have reviewed the patient's chart and labs.  Questions were answered to the patient's satisfaction.     Lewayne Bunting

## 2020-07-23 NOTE — Plan of Care (Signed)

## 2020-07-23 NOTE — Progress Notes (Addendum)
Electrophysiology Rounding Note  Patient Name: Jacob Benitez Date of Encounter: 07/23/2020  Primary Cardiologist: No primary care provider on file. Electrophysiologist: New   Subjective   The patient is doing well today.  At this time, the patient denies chest pain, shortness of breath, or any new concerns.  He vaguely remembers our discussion surrounding pacing yesterday. He would still like to proceed.   Inpatient Medications    Scheduled Meds: . aspirin EC  81 mg Oral Daily  . brimonidine  1 drop Both Eyes TID  . chlorhexidine  60 mL Topical Once  . Chlorhexidine Gluconate Cloth  6 each Topical Q0600  . dorzolamide  1 drop Both Eyes BID  . gentamicin irrigation  80 mg Irrigation To SSTC  . latanoprost  1 drop Both Eyes QHS  . lisinopril  10 mg Oral Daily  . multivitamin with minerals  1 tablet Oral Daily  . mupirocin ointment  1 application Nasal BID  . simvastatin  40 mg Oral Daily  . sodium chloride flush  3 mL Intravenous Q12H   Continuous Infusions: . sodium chloride    . sodium chloride 50 mL/hr at 07/23/20 0544  .  ceFAZolin (ANCEF) IV     PRN Meds: acetaminophen, nitroGLYCERIN   Vital Signs    Vitals:   07/22/20 2306 07/23/20 0000 07/23/20 0307 07/23/20 0400  BP: (!) 117/59 (!) 101/57 130/69 116/74  Pulse: (!) 58 (!) 58 (!) 58 (!) 56  Resp: 16 15 13 15   Temp: 98.2 F (36.8 C)  98 F (36.7 C)   TempSrc: Oral  Oral   SpO2: 96% 95% 97% 97%  Weight:    73.2 kg    Intake/Output Summary (Last 24 hours) at 07/23/2020 0859 Last data filed at 07/23/2020 0500 Gross per 24 hour  Intake --  Output 450 ml  Net -450 ml   Filed Weights   07/22/20 1949 07/23/20 0400  Weight: 74.4 kg 73.2 kg    Physical Exam    GEN- The patient is well appearing, alert and oriented x 3 today.   Head- normocephalic, atraumatic Eyes-  Sclera clear, conjunctiva pink Ears- hearing intact Oropharynx- clear Neck- supple Lungs- Clear to ausculation bilaterally,  normal work of breathing Heart- IRR no murmurs, rubs or gallops GI- soft, NT, ND, + BS Extremities- no clubbing or cyanosis. No edema Skin- no rash or lesion Psych- euthymic mood, full affect Neuro- strength and sensation are intact  Labs    CBC Recent Labs    07/22/20 1124 07/23/20 0034  WBC 5.8 6.0  HGB 14.3 12.9*  HCT 43.8 39.1  MCV 101.2* 99.0  PLT 155 136*   Basic Metabolic Panel Recent Labs    07/25/20 1124 07/23/20 0034  NA 137 139  K 4.9 4.5  CL 104 107  CO2 25 23  GLUCOSE 112* 119*  BUN 21 23  CREATININE 1.24 1.51*  CALCIUM 9.7 9.2   Liver Function Tests No results for input(s): AST, ALT, ALKPHOS, BILITOT, PROT, ALBUMIN in the last 72 hours. No results for input(s): LIPASE, AMYLASE in the last 72 hours. Cardiac Enzymes No results for input(s): CKTOTAL, CKMB, CKMBINDEX, TROPONINI in the last 72 hours.   Telemetry    AF with slow to controlled ventricular rates, as low as 30s overnight , mostly 50-60s (personally reviewed)  Radiology    DG Chest 2 View  Result Date: 07/22/2020 CLINICAL DATA:  Arrhythmia, syncopal episode. EXAM: CHEST - 2 VIEW COMPARISON:  CT chest 06/03/2020  FINDINGS: The heart size and mediastinal contours are within normal limits. Aorta atherosclerotic plaque. Flattening of the hemidiaphragms consistent with emphysema. Pericentimeter round density overlying the left lower lung zone likely represents a nipple shadow. No focal consolidation. No pulmonary edema. No pleural effusion. No pneumothorax. No acute osseous abnormality. Multilevel degenerative changes of the spine. IMPRESSION: 1. No active cardiopulmonary disease. 2. Pericentimeter round density overlying the left lower lung zone likely represents a nipple shadow. Consider use of nipple markers on follow-up chest x-ray. 3. Aortic Atherosclerosis (ICD10-I70.0) and Emphysema (ICD10-J43.9). Electronically Signed   By: Morgane  Naveau M.D.   On: 07/22/2020 11:59    Patient Profile      Jacob Benitez is a 86 y.o. male with a history of CAD s/p CABG, atrial flutter not on OAC due to "frequent falls and memory issues", syncope, HLD, Alzheimers, and DM2 who is being seen today for the evaluation of syncope and CHB on monitor at the request of Dr. Revankar, who sent patient to ED based on monitor results.  Assessment & Plan    1.  Syncope with pauses of up to 14 seconds on monitor / CHB Monitor includes daytime pauses of up to 7 seconds. Son is not sure that pt would have the wherewithal to report a syncope depending on the circumstances.  No reversible cause of CHB noted.  Pt with h/o CABG and stenting by report and chart. Update echo. Explained risks, benefits, and alternatives to PPM implantation to the patient and his son, who is POA. Risks include, but not limited to bleeding, infection, pneumothorax, pericardial effusion, lead dislodgement, heart attack, stroke, or death.  They have both verbalized understanding and agreed to proceed.  2. Longstanding persistent Atrial flutter Since at least 02/2018. Rates controlled to slow. No role for rhythm control at this time.  In the past, he has declined OAC in setting of falls and memory issues.  Discussed risk of stroke with patient and son at length; they will follow up with Dr. Revankar for further discussion.  CHA2DS2VASC of at least 5. No h/o stroke.    Stable.   3. CAD s/p CABG Denies ischemic symptoms Echo performed 12/15; results pending  4.  DM2 Not on insulin at home.  Per PCP on d/c.  5. Alzheimers At baseline, pt is forgetful, especially long term.  Otherwise very independent and able to do ADLs without assistance.  Continue aricept.  Have discussed risks and benefits of PPM insertion with his son/POA as well.    For questions or updates, please contact [facility] Please consult [email or web address] for contact info under Cardiology/STEMI.  Signed, Michael Andrew Tillery, PA-C  07/23/2020, 8:59 AM    EP Attending  Patient seen and examined. Agree with above. The patient presents with syncope and long pauses in the setting of chronic reverse typical atrial flutter. He has dementia and I have discussed the inidcations/risks/benefits/goals/expectations of PPM insertion and he wishes to proceed.  Dwain Huhn,MD  

## 2020-07-23 NOTE — Progress Notes (Signed)
PHARMACY NOTE:  ANTIMICROBIAL RENAL DOSAGE ADJUSTMENT  Current antimicrobial regimen includes a mismatch between antimicrobial dosage and estimated renal function.  As per policy approved by the Pharmacy & Therapeutics and Medical Executive Committees, the antimicrobial dosage will be adjusted accordingly.  Current antimicrobial dosage:  Cefazolin 1g IV every 6 hours  Indication: Surgical Prophylaxis   Renal Function:  Estimated Creatinine Clearance: 30.5 mL/min (A) (by C-G formula based on SCr of 1.51 mg/dL (H)). []      On intermittent HD, scheduled: []      On CRRT    Antimicrobial dosage has been changed to:  Cefazolin 1g IV every 12 hours  Additional comments:   Thank you for allowing pharmacy to be a part of this patient's care.  , PharmD, BCCCP Clinical Pharmacist  Phone: [phone] 07/23/2020 3:22 PM  Please check AMION for all [facility] Pharm[facility] phone numbers After 10:00 PM, call Main Pharmacy [phone]

## 2020-07-24 ENCOUNTER — Encounter (HOSPITAL_COMMUNITY): Payer: Self-pay | Admitting: Internal Medicine

## 2020-07-24 ENCOUNTER — Inpatient Hospital Stay (HOSPITAL_COMMUNITY): Payer: Medicare Other

## 2020-07-24 LAB — ECHOCARDIOGRAM COMPLETE
Area-P 1/2: 7.16 cm2
Calc EF: 57 %
P 1/2 time: 859 msec
S' Lateral: 2.8 cm
Single Plane A2C EF: 55.5 %
Single Plane A4C EF: 60.3 %

## 2020-07-24 LAB — GLUCOSE, CAPILLARY: Glucose-Capillary: 113 mg/dL — ABNORMAL HIGH (ref 70–99)

## 2020-07-24 MED ORDER — ACETAMINOPHEN 325 MG PO TABS: 325.0000 mg | ORAL_TABLET | ORAL | Status: AC | PRN

## 2020-07-24 MED ORDER — CLOPIDOGREL BISULFATE 75 MG PO TABS: 75.0000 mg | ORAL_TABLET | Freq: Every day | ORAL | Status: AC

## 2020-07-24 MED FILL — Cefazolin Sodium-Dextrose IV Solution 2 GM/100ML-4%: INTRAVENOUS | Qty: 100 | Status: AC

## 2020-07-24 MED FILL — Gentamicin Sulfate Inj 40 MG/ML: INTRAMUSCULAR | Qty: 80 | Status: AC

## 2020-07-24 NOTE — Discharge Instructions (Signed)
After Your Pacemaker   . You have a Environmental education officer  ACTIVITY . Do not lift your arm above shoulder height for 1 week after your procedure. After 7 days, you may progress as below.  . You should remove your sling 24 hours after your procedure, unless otherwise instructed by your provider.     Friday July 31, 2020  Saturday August 01, 2020 Sunday August 02, 2020 Monday August 03, 2020   . Do not lift, push, pull, or carry anything over 10 pounds with the affected arm until 6 weeks (Friday September 04, 2020 ) after your procedure.   . Do NOT DRIVE until you have been seen for your wound check, or as long as instructed by your healthcare provider.   . Ask your healthcare provider when you can go back to work   INCISION/Dressing . You should resume your Plavix, Monday, 12/20.  .  . If large square, outer bandage is left in place, this can be removed after 24 hours from your procedure. Do not remove steri-strips or glue as below.   . Monitor your Pacemaker site for redness, swelling, and drainage. Call the device clinic at [phone] if you experience these symptoms or fever/chills.  . If your incision is sealed with Steri-strips or staples, you may shower 10 days after your procedure or when told by your provider. Do not remove the steri-strips or let the shower hit directly on your site. You may wash around your site with soap and water.    Marland Kitchen Avoid lotions, ointments, or perfumes over your incision until it is well-healed.  . You may use a hot tub or a pool AFTER your wound check appointment if the incision is completely closed.  Marland Kitchen PAcemaker Alerts:  Some alerts are vibratory and others beep. These are NOT emergencies. Please call our office to let us know. If this occurs at night or on weekends, it can wait until the next business day. Send a remote transmission.  . If your device is capable of reading fluid status (for heart failure), you will be offered  monthly monitoring to review this with you.   DEVICE MANAGEMENT . Remote monitoring is used to monitor your pacemaker from home. This monitoring is scheduled every 91 days by our office. It allows [place] to keep an eye on the functioning of your device to ensure it is working properly. You will routinely see your Electrophysiologist annually (more often if necessary).   . You should receive your ID card for your new device in 4-8 weeks. Keep this card with you at all times once received. Consider wearing a medical alert bracelet or necklace.  . Your Pacemaker may be MRI compatible. This will be discussed at your next office visit/wound check.  You should avoid contact with strong electric or magnetic fields.    Do not use amateur (ham) radio equipment or electric (arc) welding torches. MP3 player headphones with magnets should not be used. Some devices are safe to use if held at least 12 inches (30 cm) from your Pacemaker. These include power tools, lawn mowers, and speakers. If you are unsure if something is safe to use, ask your health care provider.   When using your cell phone, hold it to the ear that is on the opposite side from the Pacemaker. Do not leave your cell phone in a pocket over the Pacemaker.   You may safely use electric blankets, heating pads, computers, and microwave ovens.  Call the office  right away if:  You have chest pain.  You feel more short of breath than you have felt before.  You feel more light-headed than you have felt before.  Your incision starts to open up.  This information is not intended to replace advice given to you by your health care provider. Make sure you discuss any questions you have with your health care provider.

## 2020-07-24 NOTE — Progress Notes (Signed)
I reviewed wound care and arm restrictions with patient and his son PRIOR to pacemaker placement.    I reviewed wound care and arm restrictions with patient and daughter in law (via phone) again today.   They will be here to get him around 630 this evening. Instructions also in AVS.   Casimiro Needle "Otilio Saber, PA-C  07/24/2020 3:21 PM

## 2020-07-24 NOTE — Discharge Summary (Addendum)
ELECTROPHYSIOLOGY PROCEDURE DISCHARGE SUMMARY    Patient ID: Jacob Benitez,  MRN: [id number], DOB/AGE: Oct 12, 1929 86 y.o.  Admit date: 07/22/2020 Discharge date: 07/24/2020  Primary Care Physician: Gordan Payment., MD  Primary Cardiologist: Dr. Tomie China Electrophysiologist: New to Dr. Ladona Ridgel  Primary Discharge Diagnosis:  Intermittent Complete Heart Block status post pacemaker implantation this admission  Secondary Discharge Diagnosis:  Syncope Alzheimers Longstanding Persistent reverse typical atrial flutter.   No Known Allergies   Procedures This Admission:  1.  Implantation of a [employer] single chamber PPM on 07/23/2020 by Dr. Ladona Ridgel. The patient received a Designer, jewellery number [id number] PPM with model number [id number] right ventricular lead. There were no immediate post procedure complications. 2.  CXR on 07/24/20 demonstrated no pneumothorax status post device implantation.   Brief HPI: [name] is a 86 y.o. male was admitted for syncope and intermittent complete heart block by monitor and electrophysiology team asked to see for consideration of PPM implantation.  Past medical history includes above.  The patient has had symptomatic bradycardia without reversible causes identified.  Risks, benefits, and alternatives to PPM implantation were reviewed with the patient who wished to proceed.   Hospital Course:  The patient was admitted and underwent implantation of a [employer] single chamber PPM with details as outlined above.  He was monitored on telemetry overnight which demonstrated NSR.  Left chest was without hematoma or ecchymosis.  The device was interrogated and found to be functioning normally.  CXR was obtained and demonstrated no pneumothorax status post device implantation.  Wound care, arm mobility, and restrictions were reviewed with the patient.  The patient was examined and considered stable for discharge to home.     Regarding blood thinner therapy, they were instructed to resume their plavix on Monday, 12/20.  Physical Exam: Vitals:   07/23/20 2330 07/24/20 0000 07/24/20 0400 07/24/20 0740  BP: (!) 105/56 101/75 139/71   Pulse: 72 72 64   Resp: 18 20 16    Temp: 98.2 F (36.8 C)  97.6 F (36.4 C) 98.3 F (36.8 C)  TempSrc: Oral  Oral Oral  SpO2: 94% 94% 97%   Weight:   72 kg     GEN- The patient is well appearing, alert and oriented x 3 today.   HEENT: normocephalic, atraumatic; sclera clear, conjunctiva pink; hearing intact; oropharynx clear; neck supple, no JVP Lymph- no cervical lymphadenopathy Lungs- Clear to ausculation bilaterally, normal work of breathing.  No wheezes, rales, rhonchi Heart- Regular rate and rhythm, no murmurs, rubs or gallops, PMI not laterally displaced GI- soft, non-tender, non-distended, bowel sounds present, no hepatosplenomegaly Extremities- no clubbing, cyanosis, or edema; DP/PT/radial pulses 2+ bilaterally MS- no significant deformity or atrophy Skin- warm and dry, no rash or lesion, left chest without hematoma/ecchymosis Psych- euthymic mood, full affect Neuro- strength and sensation are intact   Labs:   Lab Results  Component Value Date   WBC 6.0 07/23/2020   HGB 12.9 (L) 07/23/2020   HCT 39.1 07/23/2020   MCV 99.0 07/23/2020   PLT 136 (L) 07/23/2020    Recent Labs  Lab 07/23/20 0034  NA 139  K 4.5  CL 107  CO2 23  BUN 23  CREATININE 1.51*  CALCIUM 9.2  GLUCOSE 119*    Discharge Medications:  Allergies as of 07/24/2020   No Known Allergies      Medication List     TAKE these medications    acetaminophen 325 MG tablet Commonly known  as: TYLENOL Take 1-2 tablets (325-650 mg total) by mouth every 4 (four) hours as needed for mild pain.   Alphagan P 0.1 % Soln Generic drug: brimonidine Place 1 drop into both eyes 3 (three) times daily.   aspirin EC 81 MG tablet Take 81 mg by mouth daily.   clopidogrel 75 MG  tablet Commonly known as: PLAVIX Take 1 tablet (75 mg total) by mouth daily. Start taking on: July 27, 2020 What changed: These instructions start on July 27, 2020. If you are unsure what to do until then, ask your doctor or other care provider.   donepezil 10 MG tablet Commonly known as: ARICEPT Take 20 mg by mouth daily.   dorzolamide 2 % ophthalmic solution Commonly known as: TRUSOPT Place 1 drop into both eyes in the morning and at bedtime.   ibuprofen 200 MG tablet Commonly known as: ADVIL Take 200 mg by mouth every 6 (six) hours as needed for moderate pain.   ICAPS AREDS FORMULA PO Take 1 tablet by mouth in the morning and at bedtime.   latanoprost 0.005 % ophthalmic solution Commonly known as: XALATAN Place 1 drop into both eyes at bedtime.   lisinopril 10 MG tablet Commonly known as: ZESTRIL Take 1 tablet (10 mg total) by mouth daily.   multivitamin capsule Take 1 capsule by mouth daily.   nitroGLYCERIN 0.4 MG SL tablet Commonly known as: NITROSTAT Place 1 tablet (0.4 mg total) under the tongue every 5 (five) minutes as needed for chest pain.   simvastatin 40 MG tablet Commonly known as: ZOCOR Take 40 mg by mouth daily.        Disposition:     Follow-up Information     Marinus Maw, MD Follow up on 10/27/2020.   Specialty: Cardiology Why: at 315 for 3 month pacemaker check Contact information: [address] [place] [address] [phone]         [facility] Follow up on 08/04/2020.   Why: at 0840 for post pacemaker wound check Contact information: [address] [place] [address] [phone]                Duration of Discharge Encounter: Greater than 30 minutes including physician time.  Dustin Flock, PA-C  07/24/2020 10:02 AM   EP Attending  Patient seen and examined. Agree with the findings as  noted above. The patient is doing well after PPM insertion. His interrogation demonstrates normal PPM function. He will be discharged home with usual followup.  Sharlot Gowda Shonica Weier,MD

## 2020-08-04 ENCOUNTER — Other Ambulatory Visit: Payer: Self-pay

## 2020-08-04 ENCOUNTER — Ambulatory Visit (INDEPENDENT_AMBULATORY_CARE_PROVIDER_SITE_OTHER): Payer: Medicare Other | Admitting: Emergency Medicine

## 2020-08-04 DIAGNOSIS — R001 Bradycardia, unspecified: Secondary | ICD-10-CM

## 2020-08-04 DIAGNOSIS — Z950 Presence of cardiac pacemaker: Secondary | ICD-10-CM

## 2020-08-04 LAB — CUP PACEART INCLINIC DEVICE CHECK
Brady Statistic RV Percent Paced: 24 %
Date Time Interrogation Session: 20211228093144
Implantable Lead Implant Date: 20211216
Implantable Lead Location: 753860
Implantable Lead Model: 7842
Implantable Lead Serial Number: 1057349
Implantable Pulse Generator Implant Date: 20211216
Lead Channel Impedance Value: 854 Ohm
Lead Channel Pacing Threshold Amplitude: 0.6 V
Lead Channel Pacing Threshold Pulse Width: 0.4 ms
Lead Channel Sensing Intrinsic Amplitude: 18.5 mV
Lead Channel Setting Pacing Amplitude: 3.5 V
Lead Channel Setting Pacing Pulse Width: 0.4 ms
Lead Channel Setting Sensing Sensitivity: 2.5 mV
Pulse Gen Serial Number: 835193

## 2020-08-04 NOTE — Progress Notes (Signed)
Wound check appointment. Steri-strips removed. Wound without redness or edema. Incision edges approximated, wound well healed.Small area of skin abrasion 4 inches below wound site, no drainage, no edema, and area not warm to touch. Normal device function. Threshold, sensing, and impedances consistent with implant measurements. Device programmed at 3.5V/auto capture programmed on for extra safety margin until 3 month visit. Histogram distribution appropriate for patient and level of activity. No high ventricular rates noted. Patient educated about wound care, arm mobility, lifting restrictions. ROV with Dr Ladona Ridgel 10/27/20. Waiting for delivary of home monitor due to back order at [employer]. Patient HOH and has short term memory issues. Son present for appointment and education.

## 2020-08-26 ENCOUNTER — Ambulatory Visit: Payer: Medicare Other | Admitting: Cardiology

## 2020-08-27 ENCOUNTER — Ambulatory Visit: Payer: Medicare Other | Admitting: Cardiology

## 2020-08-27 ENCOUNTER — Other Ambulatory Visit: Payer: Self-pay

## 2020-08-27 ENCOUNTER — Encounter: Payer: Self-pay | Admitting: Cardiology

## 2020-08-27 VITALS — BP 138/68 | HR 56 | Ht 68.0 in | Wt 165.8 lb

## 2020-08-27 DIAGNOSIS — I2510 Atherosclerotic heart disease of native coronary artery without angina pectoris: Secondary | ICD-10-CM | POA: Diagnosis not present

## 2020-08-27 DIAGNOSIS — I442 Atrioventricular block, complete: Secondary | ICD-10-CM

## 2020-08-27 DIAGNOSIS — I10 Essential (primary) hypertension: Secondary | ICD-10-CM | POA: Diagnosis not present

## 2020-08-27 DIAGNOSIS — E1122 Type 2 diabetes mellitus with diabetic chronic kidney disease: Secondary | ICD-10-CM | POA: Diagnosis not present

## 2020-08-27 DIAGNOSIS — N183 Chronic kidney disease, stage 3 unspecified: Secondary | ICD-10-CM

## 2020-08-27 LAB — BASIC METABOLIC PANEL
BUN/Creatinine Ratio: 20 (ref 10–24)
BUN: 24 mg/dL (ref 8–27)
CO2: 24 mmol/L (ref 20–29)
Calcium: 9.6 mg/dL (ref 8.6–10.2)
Chloride: 105 mmol/L (ref 96–106)
Creatinine, Ser: 1.2 mg/dL (ref 0.76–1.27)
GFR calc Af Amer: 62 mL/min/{1.73_m2} (ref >59)
GFR calc non Af Amer: 54 mL/min/{1.73_m2} — ABNORMAL LOW (ref >59)
Glucose: 101 mg/dL — ABNORMAL HIGH (ref 65–99)
Potassium: 4.7 mmol/L (ref 3.5–5.2)
Sodium: 141 mmol/L (ref 134–144)

## 2020-08-27 NOTE — Patient Instructions (Signed)
Medication Instructions:  No medication changes.  Please check with your PCP to see if you need Plavix.  *If you need a refill on your cardiac medications before your next appointment, please call your pharmacy*   Lab Work: Your physician recommends that you have labs done in the office today. Your test included a  basic metabolic panel.  If you have labs (blood work) drawn today and your tests are completely normal, you will receive your results only by: Marland Kitchen MyChart Message (if you have MyChart) OR . A paper copy in the mail If you have any lab test that is abnormal or we need to change your treatment, we will call you to review the results.   Testing/Procedures: None ordered   Follow-Up: At [facility], you and your health needs are our priority.  As part of our continuing mission to provide you with exceptional heart care, we have created designated Provider Care Teams.  These Care Teams include your primary Cardiologist (physician) and Advanced Practice Providers (APPs -  Physician Assistants and Nurse Practitioners) who all work together to provide you with the care you need, when you need it.  We recommend signing up for the patient portal called "MyChart".  Sign up information is provided on this After Visit Summary.  MyChart is used to connect with patients for Virtual Visits (Telemedicine).  Patients are able to view lab/test results, encounter notes, upcoming appointments, etc.  Non-urgent messages can be sent to your provider as well.   To learn more about what you can do with MyChart, go to [email or web address].    Your next appointment:   6 month(s)  The format for your next appointment:   In Person  Provider:   Belva Crome, MD   Other Instructions NA

## 2020-08-27 NOTE — Progress Notes (Signed)
Cardiology Office Note:    Date:  08/27/2020   ID:  Jacob Benitez, DOB 11-May-1930, MRN [id number]  PCP:  Gordan Payment., MD  Cardiologist:  Garwin Brothers, MD   Referring MD: Gordan Payment., MD    ASSESSMENT:    1. CAD in native artery   2. Essential (primary) hypertension   3. Complete heart block (HCC)   4. Type 2 diabetes mellitus with stage 3 chronic kidney disease, without long-term current use of insulin, unspecified whether stage 3a or 3b CKD (HCC)    PLAN:    In order of problems listed above:  1. Coronary artery disease: Secondary prevention stressed with the patient.  Importance of compliance with diet medication stressed any vocalized understanding. 2. Complete heart block post permanent pacemaker: Single-chamber: Patient is comfortable and happy with this.  He has had no episodes of dizziness or syncope since.  I reviewed these details with him and questions were answered to satisfaction.  Pacemaker implantation site has healed well. 3. Essential hypertension: Blood pressure stable and diet was emphasized 4. Mixed dyslipidemia: Diet emphasized.  On statin therapy.Patient will be seen in follow-up appointment in 6 months or earlier if the patient has any concerns    Medication Adjustments/Labs and Tests Ordered: Current medicines are reviewed at length with the patient today.  Concerns regarding medicines are outlined above.  Orders Placed This Encounter  Procedures  . Basic metabolic panel   No orders of the defined types were placed in this encounter.    No chief complaint on file.    History of Present Illness:    Jacob Benitez is a 87 y.o. male.  Patient has past medical history of coronary artery disease, essential hypertension and dyslipidemia.  He has significant Alzheimer's disease.  He lives by himself.  His son lives next door and is very supportive.  At the time of my evaluation, the patient is alert awake oriented and in no distress.   The patient underwent monitoring and had prolonged episodes of sinus arrest and complete heart block for which he successfully received a single-chamber pacemaker and this is followed by our electrophysiology colleagues.  He feels better since.  No chest pain orthopnea or PND.  At the time of my evaluation, the patient is alert awake oriented and in no distress.  Past Medical History:  Diagnosis Date  . Actinic keratoses 12/07/2017  . Atrial flutter (HCC) 02/26/2018  . Back pain 12/01/2015  . Benign prostatic hyperplasia without lower urinary tract symptoms 06/30/2016   Surgery by [facility] in 2016.  Marland Kitchen CAD in native artery 02/18/2015  . CKD (chronic kidney disease) stage 3, GFR 30-59 ml/min (HCC) 04/23/2018  . Complete heart block (HCC) 07/22/2020  . Essential (primary) hypertension 02/18/2015  . Generalized anxiety disorder 06/29/2016  . H/O heart artery stent 04/12/2017  . High risk medication use 06/29/2016  . Hypercholesteremia   . Hypertension   . Late onset Alzheimer's disease without behavioral disturbance (HCC) 06/29/2016  . Malaise and fatigue 06/29/2016  . Mixed hyperlipidemia 06/29/2016  . Orthostatic hypotension 06/29/2016  . Osteomyelitis of lumbar spine (HCC) 10/29/2015  . Senile purpura (HCC) 12/04/2018  . Severe hearing loss of both ears 06/29/2016  . Spinal epidural abscess 10/29/2015  . Syncope 06/29/2016  . Tremor 06/29/2016  . Type 2 diabetes mellitus with stage 3 chronic kidney disease, without long-term current use of insulin (HCC) 06/29/2016  . Vertebral osteomyelitis (HCC)   . Vitamin D deficiency 06/29/2016  Past Surgical History:  Procedure Laterality Date  . CORONARY STENT PLACEMENT    . PACEMAKER IMPLANT N/A 07/23/2020   Procedure: PACEMAKER IMPLANT;  Surgeon: Marinus Maw, MD;  Location: [facility] INVASIVE CV LAB;  Service: Cardiovascular;  Laterality: N/A;  . PROCTECTOMY      Current Medications: Current Meds  Medication Sig  . acetaminophen (TYLENOL) 325  MG tablet Take 1-2 tablets (325-650 mg total) by mouth every 4 (four) hours as needed for mild pain.  Marland Kitchen aspirin EC 81 MG tablet Take 81 mg by mouth daily.   . brimonidine (ALPHAGAN P) 0.1 % SOLN Place 1 drop into both eyes 3 (three) times daily.  . clopidogrel (PLAVIX) 75 MG tablet Take 1 tablet (75 mg total) by mouth daily.  Marland Kitchen donepezil (ARICEPT) 10 MG tablet Take 20 mg by mouth daily.   . dorzolamide (TRUSOPT) 2 % ophthalmic solution Place 1 drop into both eyes in the morning and at bedtime.  Marland Kitchen ibuprofen (ADVIL,MOTRIN) 200 MG tablet Take 200 mg by mouth every 6 (six) hours as needed for moderate pain.  Marland Kitchen latanoprost (XALATAN) 0.005 % ophthalmic solution Place 1 drop into both eyes at bedtime.  Marland Kitchen lisinopril (ZESTRIL) 10 MG tablet Take 1 tablet (10 mg total) by mouth daily.  . Multiple Vitamin (MULTIVITAMIN) capsule Take 1 capsule by mouth daily.  . Multiple Vitamins-Minerals (ICAPS AREDS FORMULA PO) Take 1 tablet by mouth in the morning and at bedtime.  . nitroGLYCERIN (NITROSTAT) 0.4 MG SL tablet Place 1 tablet (0.4 mg total) under the tongue every 5 (five) minutes as needed for chest pain.  . Omega-3 Fatty Acids (FISH OIL) 500 MG CAPS Take 500 mg by mouth daily.  . simvastatin (ZOCOR) 40 MG tablet Take 40 mg by mouth daily.     Allergies:   Patient has no known allergies.   Social History   Socioeconomic History  . Marital status: Single    Spouse name: Not on file  . Number of children: Not on file  . Years of education: Not on file  . Highest education level: Not on file  Occupational History  . Not on file  Tobacco Use  . Smoking status: Former Smoker    Packs/day: 1.00    Years: 20.00    Pack years: 20.00    Types: Cigarettes    Quit date: 10/29/1978    Years since quitting: 41.8  . Smokeless tobacco: Never Used  Vaping Use  . Vaping Use: Never used  Substance and Sexual Activity  . Alcohol use: No    Alcohol/week: 0.0 standard drinks  . Drug use: No  . Sexual  activity: Not on file  Other Topics Concern  . Not on file  Social History Narrative  . Not on file   Social Determinants of Health   Financial Resource Strain: Not on file  Food Insecurity: Not on file  Transportation Needs: Not on file  Physical Activity: Not on file  Stress: Not on file  Social Connections: Not on file     Family History: The patient's family history includes Colon cancer in his mother.  ROS:   Please see the history of present illness.    All other systems reviewed and are negative.  EKGs/Labs/Other Studies Reviewed:    The following studies were reviewed today: I reviewed reports including placement implantation reports.   Recent Labs: 07/23/2020: BUN 23; Creatinine, Ser 1.51; Hemoglobin 12.9; Platelets 136; Potassium 4.5; Sodium 139  Recent Lipid Panel  Component Value Date/Time   CHOL 173 05/14/2019 0907   TRIG 86 05/14/2019 0907   HDL 59 05/14/2019 0907   CHOLHDL 2.9 05/14/2019 0907   LDLCALC 98 05/14/2019 0907    Physical Exam:    VS:  BP 138/68   Pulse (!) 56   Ht 5\' 8"  (1.727 m)   Wt 165 lb 12.8 oz (75.2 kg)   SpO2 99%   BMI 25.21 kg/m     Wt Readings from Last 3 Encounters:  08/27/20 165 lb 12.8 oz (75.2 kg)  07/24/20 158 lb 11.7 oz (72 kg)  06/23/20 169 lb 6.4 oz (76.8 kg)     GEN: Patient is in no acute distress HEENT: Normal NECK: No JVD; No carotid bruits LYMPHATICS: No lymphadenopathy CARDIAC: Hear sounds regular, 2/6 systolic murmur at the apex. RESPIRATORY:  Clear to auscultation without rales, wheezing or rhonchi  ABDOMEN: Soft, non-tender, non-distended MUSCULOSKELETAL:  No edema; No deformity  SKIN: Warm and dry NEUROLOGIC:  Alert and oriented x 3 PSYCHIATRIC:  Normal affect   Signed, 06/25/20, MD  08/27/2020 8:52 AM    [facility]

## 2020-10-27 ENCOUNTER — Encounter: Payer: Medicare Other | Admitting: Internal Medicine

## 2020-11-05 IMAGING — CT CT CERVICAL SPINE W/O CM
4 of 5 series · 14 of 33 positions shown, 16 images · non-contrast
Comparison: June 23, 2019.

CLINICAL DATA: Neck injury after fall off lawnmower.

EXAM:
CT HEAD WITHOUT CONTRAST
CT CERVICAL SPINE WITHOUT CONTRAST
TECHNIQUE: Multidetector CT imaging of the head and cervical spine was
performed following the standard protocol without intravenous
contrast. Multiplanar CT image reconstructions of the cervical spine
were also generated.

[Series 5: c_spine 2.0 st · axial · 0.34mm/px · z∈[-40,+74]mm · 4 of 96 slices shown, 5 images]
[im 20/96  soft-tissue]
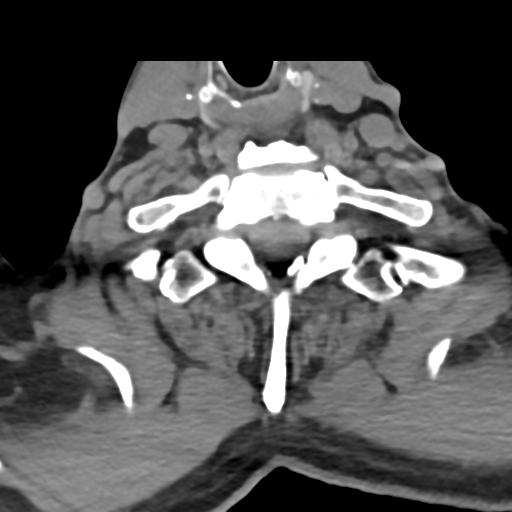
[im 20/96  bone]
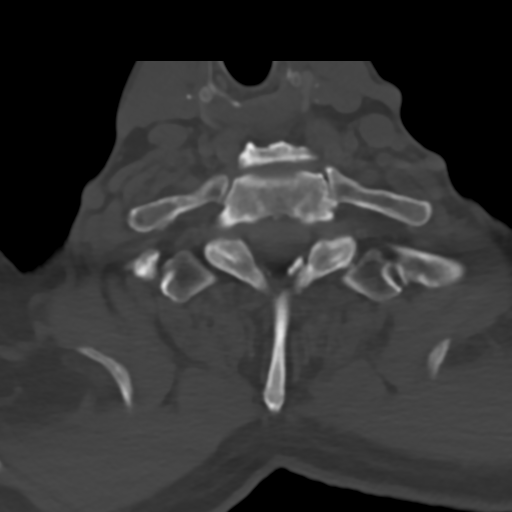
[im 39/96  bone]
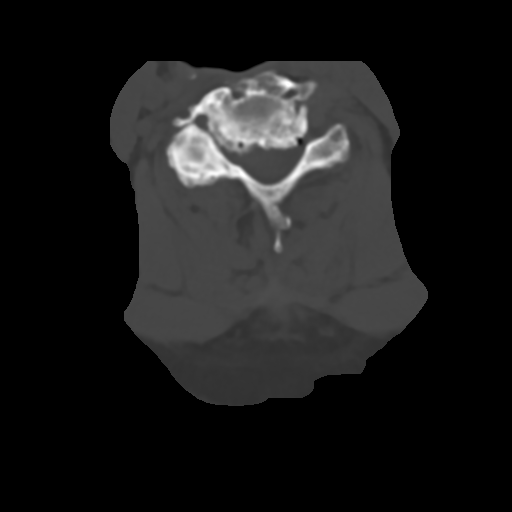
[im 58/96  bone]
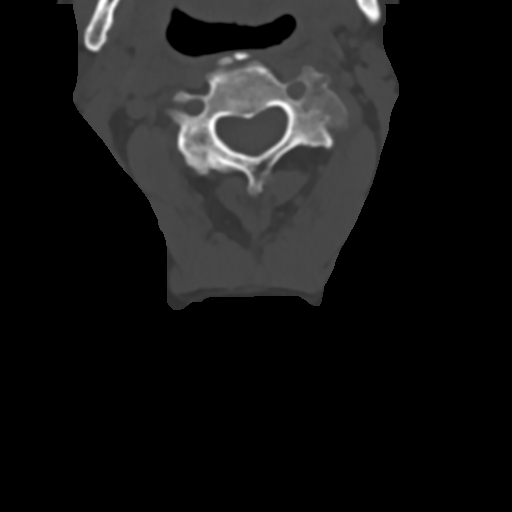
[im 77/96  bone]
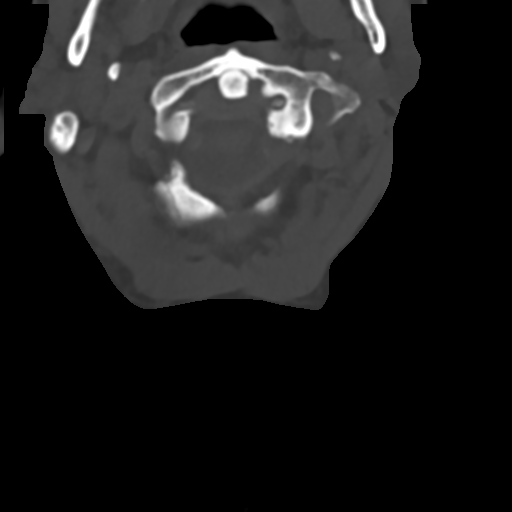

[Series 9: c_spine 2.0 orthogonals · axial · 0.21mm/px · z∈[-65,-33]mm · 2 of 94 slices shown]
[im 19/94  bone]
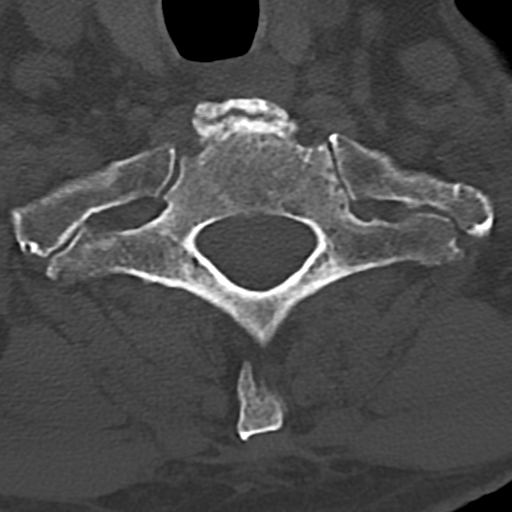
[im 38/94  bone]
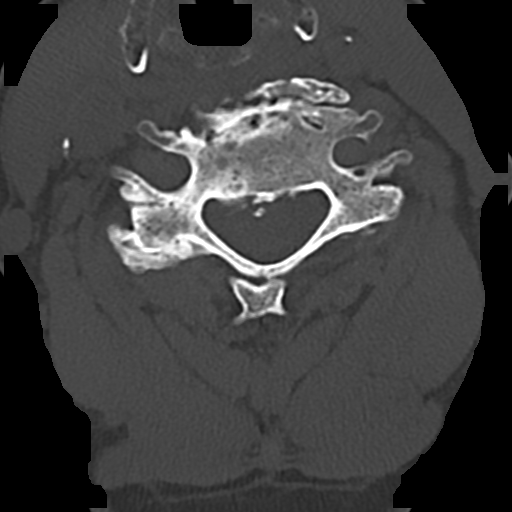

[Series 10: c_spine 2.0 sag bone · sagittal · 0.33mm/px · 5 of 70 slices shown, 6 images]
[im 24/70  bone]
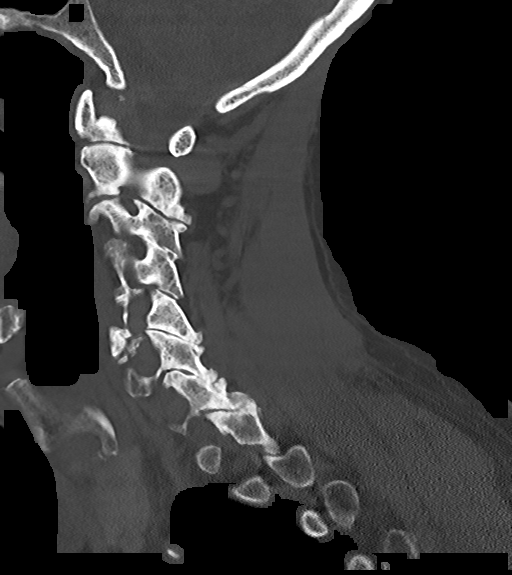
[im 29/70  bone]
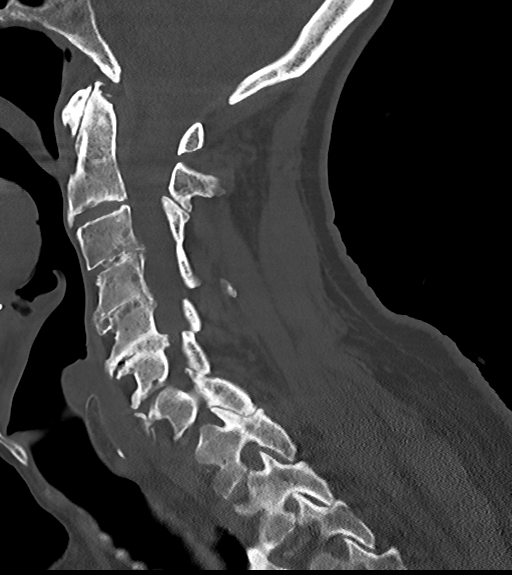
[im 35/70  soft-tissue]
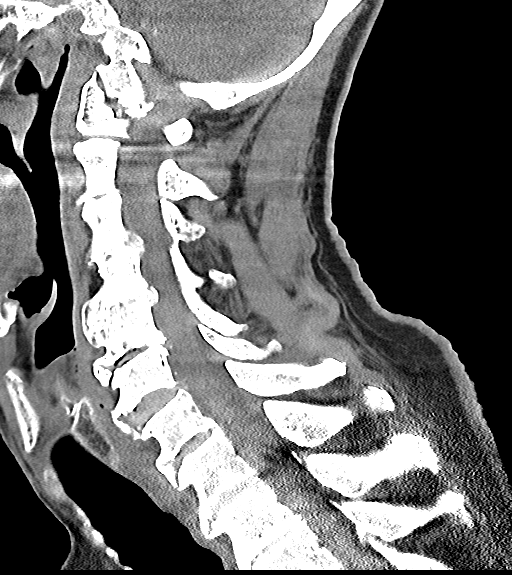
[im 35/70  bone]
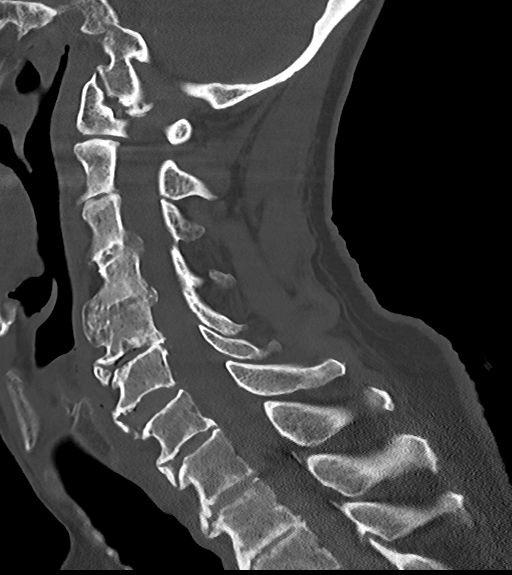
[im 41/70  bone]
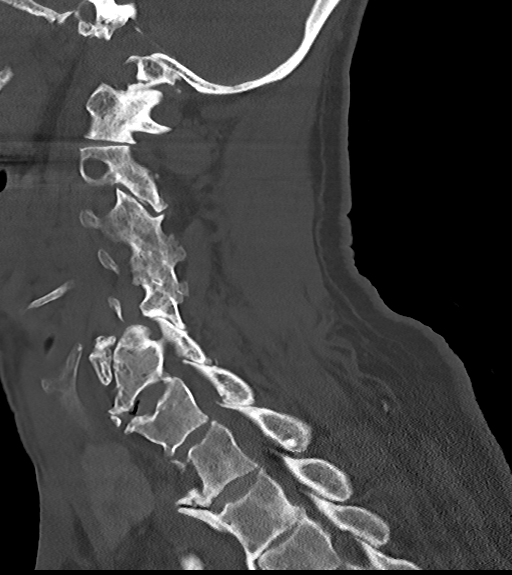
[im 47/70  bone]
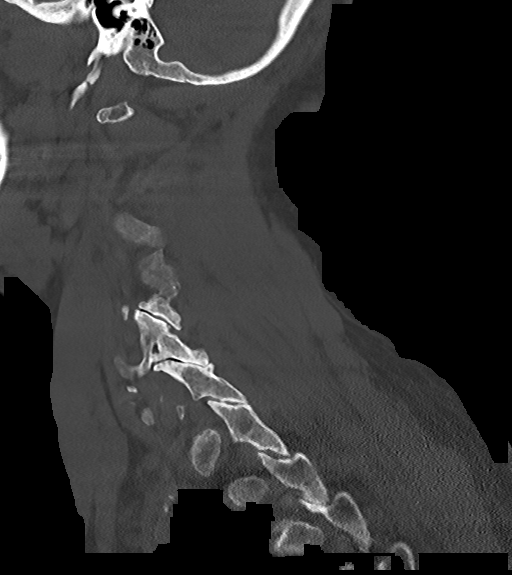

[Series 11: c_spine 2.0 cor bone · coronal · 0.27mm/px · 3 of 78 slices shown]
[im 16/78  bone]
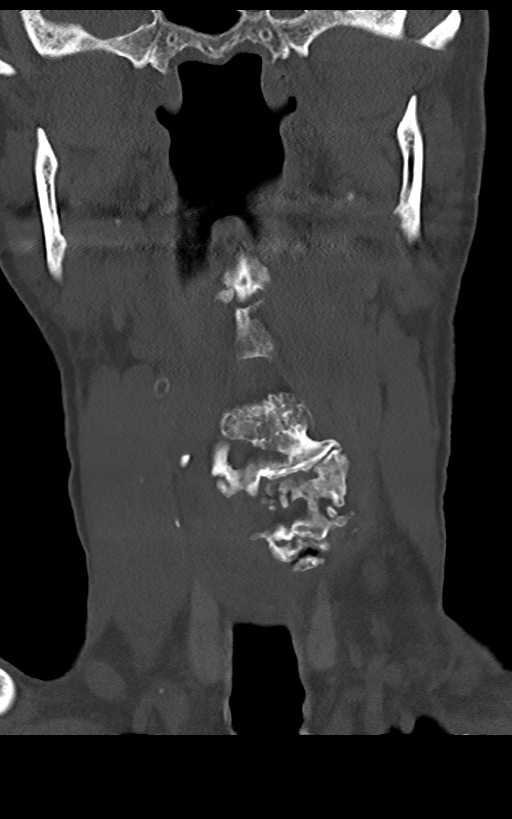
[im 31/78  bone]
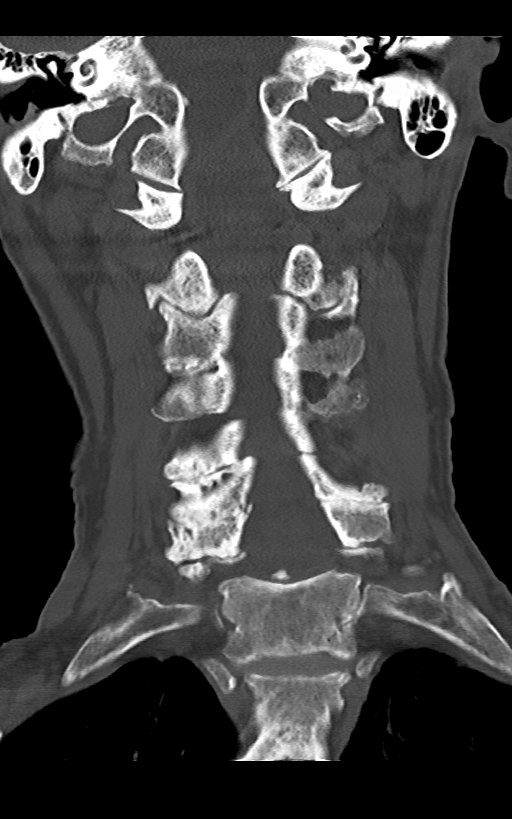
[im 47/78  bone]
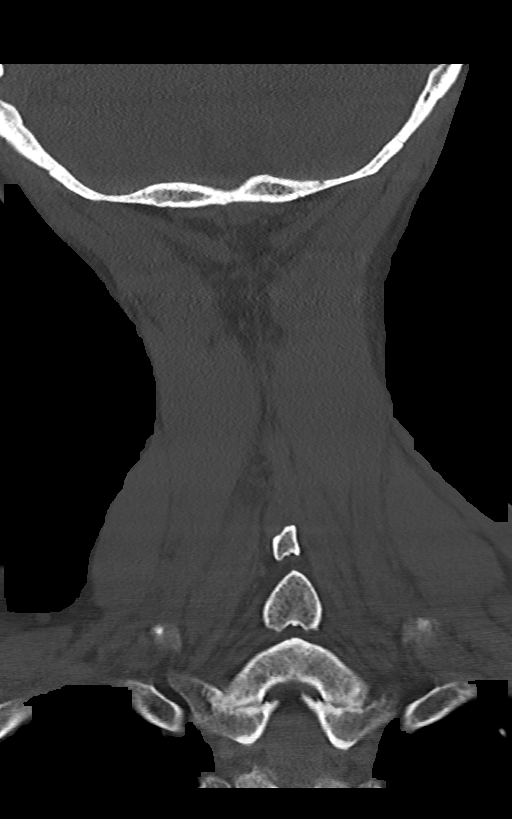

[14 of 33 positions shown; findings below may reference images not displayed]

FINDINGS: CT HEAD FINDINGS

Brain: Mild chronic ischemic white matter disease is noted. Mild
diffuse cortical atrophy is noted. No mass effect or midline shift
is noted. Ventricular size is within normal limits. There is no
evidence of mass lesion, hemorrhage or acute infarction.

Vascular: No hyperdense vessel or unexpected calcification.

Skull: Normal. Negative for fracture or focal lesion.

Sinuses/Orbits: Bilateral ethmoid sinusitis is noted.

Other: None.

CT CERVICAL SPINE FINDINGS

Alignment: Normal.

Skull base and vertebrae: No acute fracture. No primary bone lesion
or focal pathologic process.

Soft tissues and spinal canal: No prevertebral fluid or swelling. No
visible canal hematoma.

Disc levels: Severe degenerative disc disease is noted at C3-4, C4-5
and C5-6. Mild degenerative disc disease is noted at C6-7 with
anterior osteophyte formation.

Upper chest: Negative.

Other: Degenerative changes are seen involving posterior facet
joints bilaterally.
IMPRESSION: 1. Mild chronic ischemic white matter disease. Mild diffuse cortical
atrophy. No acute intracranial abnormality seen.
2. Severe multilevel degenerative disc disease. No acute abnormality
seen in the cervical spine.

## 2020-12-24 IMAGING — DX DG CHEST 2V
2 series · 2 of 2 positions shown · non-contrast
Comparison: CT chest 06/03/2020

CLINICAL DATA: Arrhythmia, syncopal episode.

EXAM:
CHEST - 2 VIEW

[chest pa]
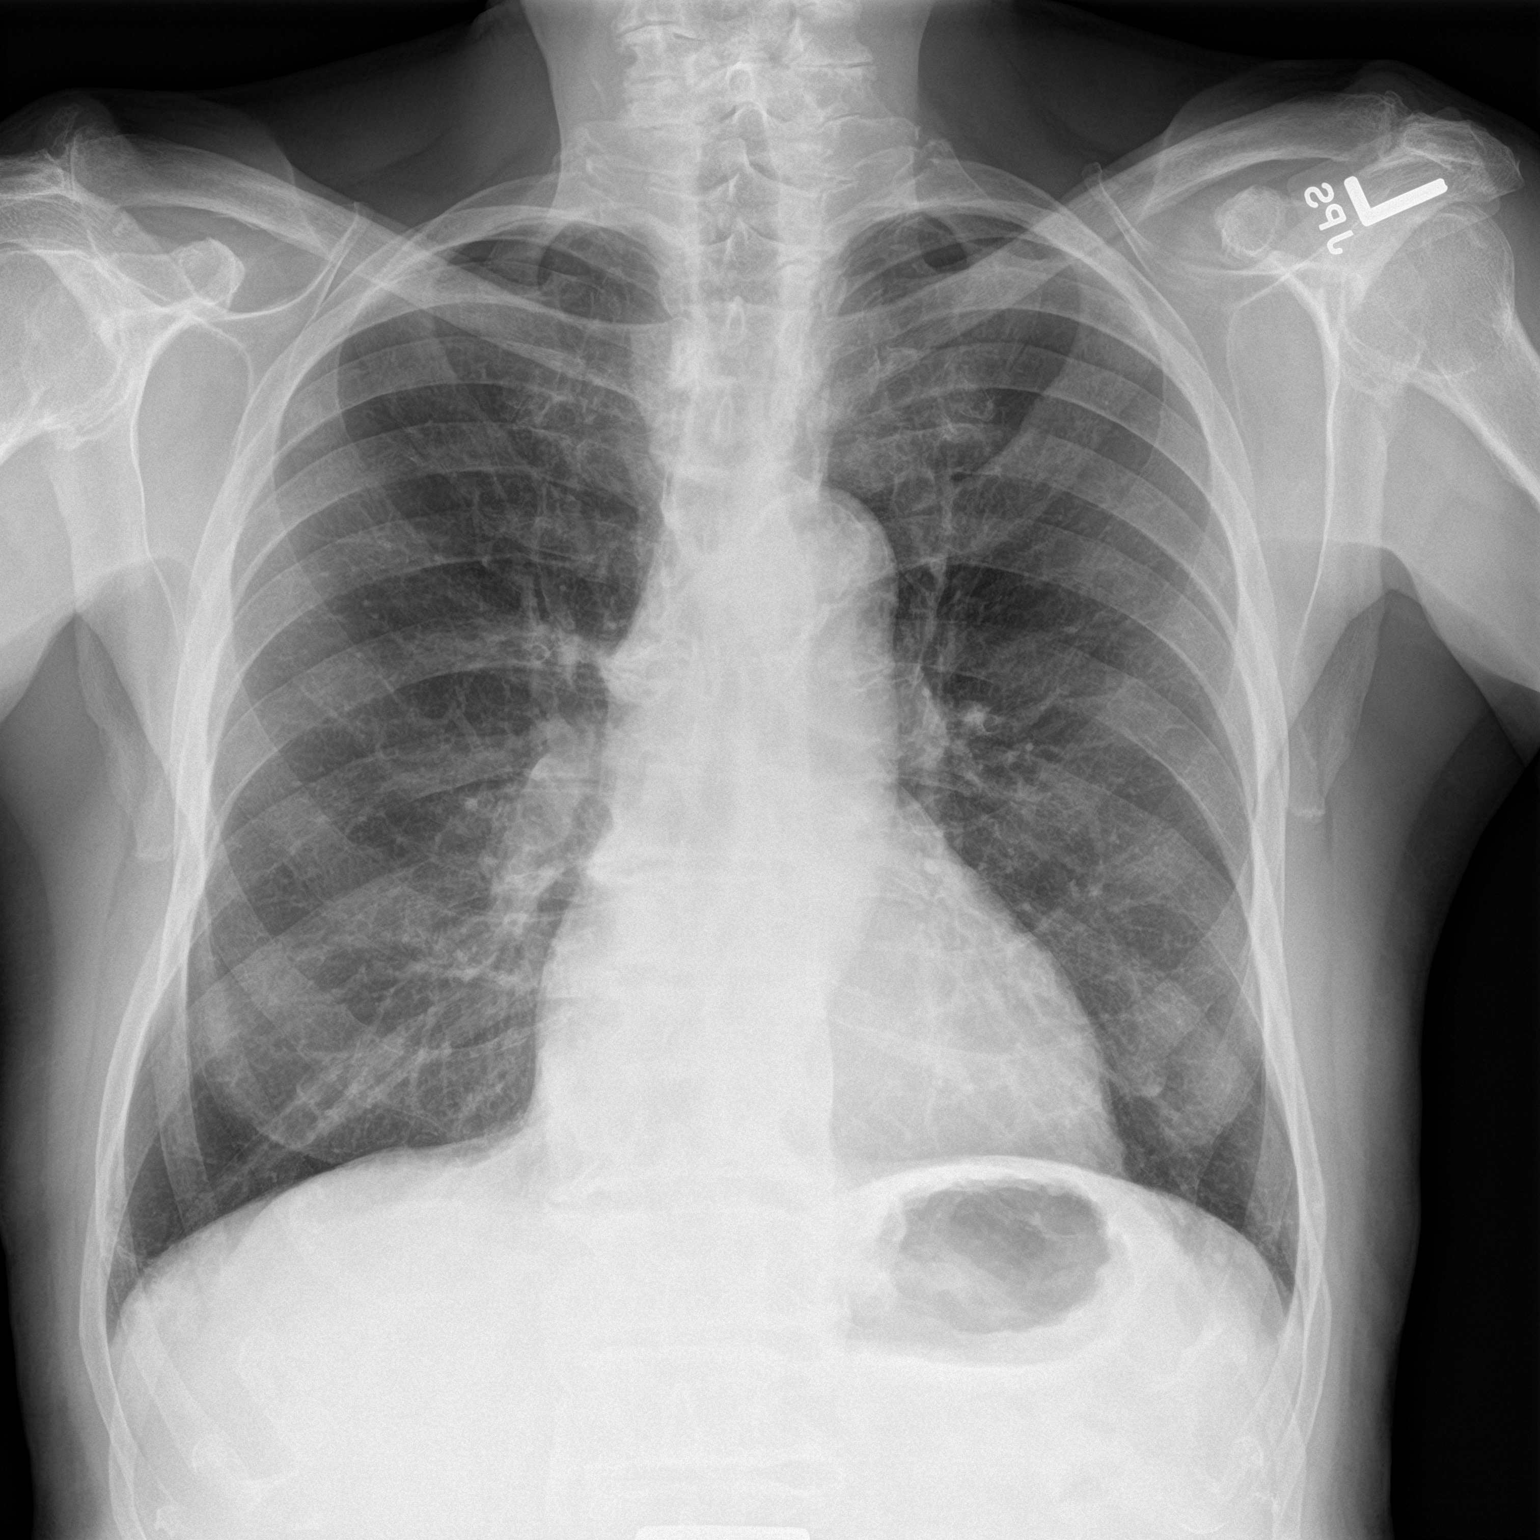

[chest lat]
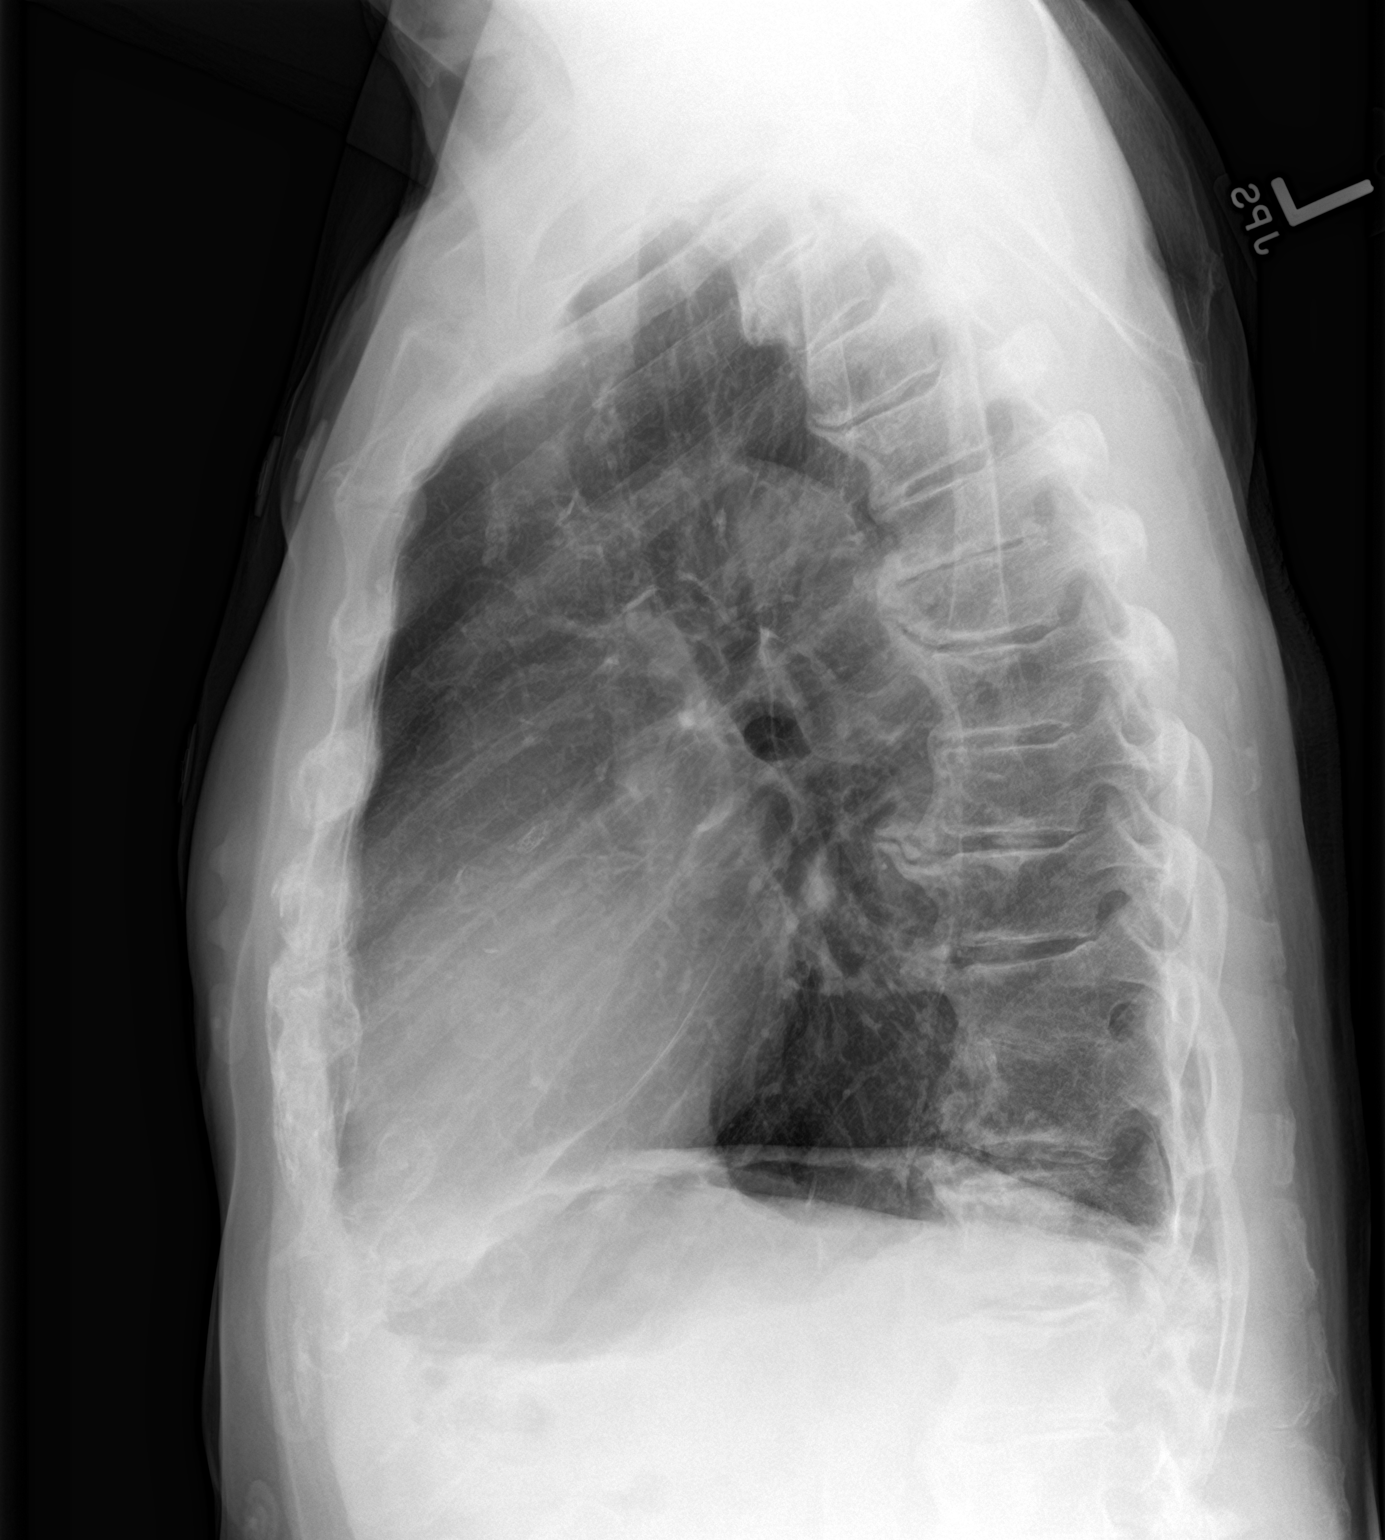

[2 of 2 positions shown; findings below may reference images not displayed]

FINDINGS: The heart size and mediastinal contours are within normal limits.
Aorta atherosclerotic plaque.

Flattening of the hemidiaphragms consistent with emphysema.
Pericentimeter round density overlying the left lower lung zone
likely represents a nipple shadow. No focal consolidation. No
pulmonary edema. No pleural effusion. No pneumothorax.

No acute osseous abnormality. Multilevel degenerative changes of the
spine.
IMPRESSION: 1. No active cardiopulmonary disease.
2. Pericentimeter round density overlying the left lower lung zone
likely represents a nipple shadow. Consider use of nipple markers on
follow-up chest x-ray.
3. Aortic Atherosclerosis (SDX85-ER6.6) and Emphysema (SDX85-QD9.C).

## 2021-01-06 ENCOUNTER — Telehealth: Payer: Self-pay

## 2021-01-06 NOTE — Telephone Encounter (Signed)
LMOVM on patient and his son number. I left my direct number and Joey number. I was trying to let the patient know that Aurelio Brash was trying to reach him to give him a monitor for his BSX pacemaker. I told the patient and his son to call the device clinic or [facility] to get the monitor.

## 2021-01-07 NOTE — Telephone Encounter (Signed)
The patient son returned my call. He is going to call [facility] for his monitor.

## 2021-01-29 ENCOUNTER — Ambulatory Visit (INDEPENDENT_AMBULATORY_CARE_PROVIDER_SITE_OTHER): Payer: No Typology Code available for payment source

## 2021-01-29 DIAGNOSIS — R001 Bradycardia, unspecified: Secondary | ICD-10-CM

## 2021-01-29 DIAGNOSIS — I442 Atrioventricular block, complete: Secondary | ICD-10-CM

## 2021-01-29 LAB — CUP PACEART REMOTE DEVICE CHECK
Battery Remaining Longevity: 126 mo
Battery Remaining Percentage: 100 %
Brady Statistic RV Percent Paced: 16 %
Date Time Interrogation Session: 20220623183100
Implantable Lead Implant Date: 20211216
Implantable Lead Location: 753860
Implantable Lead Model: 7842
Implantable Lead Serial Number: 1057349
Implantable Pulse Generator Implant Date: 20211216
Lead Channel Impedance Value: 825 Ohm
Lead Channel Pacing Threshold Amplitude: 0.5 V
Lead Channel Pacing Threshold Pulse Width: 0.4 ms
Lead Channel Setting Pacing Amplitude: 3.5 V
Lead Channel Setting Pacing Pulse Width: 0.4 ms
Lead Channel Setting Sensing Sensitivity: 2.5 mV
Pulse Gen Serial Number: 835193

## 2021-02-12 NOTE — Progress Notes (Signed)
Remote pacemaker transmission.   

## 2021-02-18 ENCOUNTER — Telehealth: Payer: Self-pay | Admitting: Cardiology

## 2021-02-18 NOTE — Telephone Encounter (Signed)
Jacob Benitez is calling from [facility] requesting all the OV notes with any EKG's, Labs, or Test ran with them sent to their office. The best fax number for this is [phone]. Please advise.

## 2021-02-18 NOTE — Telephone Encounter (Signed)
Records sent via Epic. 

## 2021-04-30 ENCOUNTER — Ambulatory Visit (INDEPENDENT_AMBULATORY_CARE_PROVIDER_SITE_OTHER): Payer: No Typology Code available for payment source

## 2021-04-30 DIAGNOSIS — R001 Bradycardia, unspecified: Secondary | ICD-10-CM

## 2021-04-30 DIAGNOSIS — I442 Atrioventricular block, complete: Secondary | ICD-10-CM

## 2021-05-02 LAB — CUP PACEART REMOTE DEVICE CHECK
Battery Remaining Longevity: 120 mo
Battery Remaining Percentage: 100 %
Brady Statistic RV Percent Paced: 14 %
Date Time Interrogation Session: 20220923011000
Implantable Lead Implant Date: 20211216
Implantable Lead Location: 753860
Implantable Lead Model: 7842
Implantable Lead Serial Number: 1057349
Implantable Pulse Generator Implant Date: 20211216
Lead Channel Impedance Value: 747 Ohm
Lead Channel Pacing Threshold Amplitude: 0.5 V
Lead Channel Pacing Threshold Pulse Width: 0.4 ms
Lead Channel Setting Pacing Amplitude: 3.5 V
Lead Channel Setting Pacing Pulse Width: 0.4 ms
Lead Channel Setting Sensing Sensitivity: 2.5 mV
Pulse Gen Serial Number: 835193

## 2021-05-05 NOTE — Progress Notes (Signed)
Remote pacemaker transmission.   

## 2021-07-30 ENCOUNTER — Ambulatory Visit (INDEPENDENT_AMBULATORY_CARE_PROVIDER_SITE_OTHER): Payer: No Typology Code available for payment source

## 2021-07-30 DIAGNOSIS — I442 Atrioventricular block, complete: Secondary | ICD-10-CM

## 2021-07-30 LAB — CUP PACEART REMOTE DEVICE CHECK
Battery Remaining Longevity: 114 mo
Battery Remaining Percentage: 100 %
Brady Statistic RV Percent Paced: 13 %
Date Time Interrogation Session: 20221223034700
Implantable Lead Implant Date: 20211216
Implantable Lead Location: 753860
Implantable Lead Model: 7842
Implantable Lead Serial Number: 1057349
Implantable Pulse Generator Implant Date: 20211216
Lead Channel Impedance Value: 807 Ohm
Lead Channel Pacing Threshold Amplitude: 0.6 V
Lead Channel Pacing Threshold Pulse Width: 0.4 ms
Lead Channel Setting Pacing Amplitude: 3.5 V
Lead Channel Setting Pacing Pulse Width: 0.4 ms
Lead Channel Setting Sensing Sensitivity: 2.5 mV
Pulse Gen Serial Number: 835193

## 2021-08-10 NOTE — Progress Notes (Signed)
Remote pacemaker transmission.   

## 2021-10-29 ENCOUNTER — Ambulatory Visit (INDEPENDENT_AMBULATORY_CARE_PROVIDER_SITE_OTHER): Payer: No Typology Code available for payment source

## 2021-10-29 DIAGNOSIS — I442 Atrioventricular block, complete: Secondary | ICD-10-CM | POA: Diagnosis not present

## 2021-11-01 LAB — CUP PACEART REMOTE DEVICE CHECK
Battery Remaining Longevity: 114 mo
Battery Remaining Percentage: 100 %
Brady Statistic RV Percent Paced: 13 %
Date Time Interrogation Session: 20230324174700
Implantable Lead Implant Date: 20211216
Implantable Lead Location: 753860
Implantable Lead Model: 7842
Implantable Lead Serial Number: 1057349
Implantable Pulse Generator Implant Date: 20211216
Lead Channel Impedance Value: 767 Ohm
Lead Channel Pacing Threshold Amplitude: 0.6 V
Lead Channel Pacing Threshold Pulse Width: 0.4 ms
Lead Channel Setting Pacing Amplitude: 3.5 V
Lead Channel Setting Pacing Pulse Width: 0.4 ms
Lead Channel Setting Sensing Sensitivity: 2.5 mV
Pulse Gen Serial Number: 835193

## 2021-11-03 NOTE — Progress Notes (Signed)
Remote pacemaker transmission.   

## 2022-02-05 DEATH — deceased
